# Patient Record
Sex: Female | Born: 1935 | Race: Black or African American | Hispanic: No | Marital: Single | Smoking: Never smoker
Health system: Southern US, Community
[De-identification: ages and names within clinical notes are randomized; demographics above are authoritative.]

## PROBLEM LIST (undated history)

## (undated) DIAGNOSIS — F0280 Dementia in other diseases classified elsewhere without behavioral disturbance: Secondary | ICD-10-CM

## (undated) DIAGNOSIS — C801 Malignant (primary) neoplasm, unspecified: Secondary | ICD-10-CM

## (undated) DIAGNOSIS — G629 Polyneuropathy, unspecified: Secondary | ICD-10-CM

## (undated) DIAGNOSIS — E7800 Pure hypercholesterolemia, unspecified: Secondary | ICD-10-CM

## (undated) DIAGNOSIS — R609 Edema, unspecified: Secondary | ICD-10-CM

## (undated) DIAGNOSIS — I10 Essential (primary) hypertension: Secondary | ICD-10-CM

## (undated) DIAGNOSIS — G309 Alzheimer's disease, unspecified: Secondary | ICD-10-CM

## (undated) HISTORY — PX: ABDOMINAL HYSTERECTOMY: SHX81

## (undated) HISTORY — PX: APPENDECTOMY: SHX54

---

## 2001-01-21 ENCOUNTER — Encounter: Payer: Self-pay | Admitting: Internal Medicine

## 2001-01-21 ENCOUNTER — Ambulatory Visit (HOSPITAL_COMMUNITY): Admission: RE | Admit: 2001-01-21 | Discharge: 2001-01-21 | Payer: Self-pay | Admitting: Internal Medicine

## 2001-04-02 ENCOUNTER — Ambulatory Visit (HOSPITAL_COMMUNITY): Admission: RE | Admit: 2001-04-02 | Discharge: 2001-04-02 | Payer: Self-pay | Admitting: General Surgery

## 2001-05-06 ENCOUNTER — Encounter: Payer: Self-pay | Admitting: Internal Medicine

## 2001-05-06 ENCOUNTER — Ambulatory Visit (HOSPITAL_COMMUNITY): Admission: RE | Admit: 2001-05-06 | Discharge: 2001-05-06 | Payer: Self-pay | Admitting: Internal Medicine

## 2003-01-17 ENCOUNTER — Encounter: Payer: Self-pay | Admitting: Family Medicine

## 2003-01-17 ENCOUNTER — Ambulatory Visit (HOSPITAL_COMMUNITY): Admission: RE | Admit: 2003-01-17 | Discharge: 2003-01-17 | Payer: Self-pay | Admitting: Family Medicine

## 2004-01-19 ENCOUNTER — Ambulatory Visit (HOSPITAL_COMMUNITY): Admission: RE | Admit: 2004-01-19 | Discharge: 2004-01-19 | Payer: Self-pay | Admitting: Family Medicine

## 2005-06-14 ENCOUNTER — Ambulatory Visit (HOSPITAL_COMMUNITY): Admission: RE | Admit: 2005-06-14 | Discharge: 2005-06-14 | Payer: Self-pay | Admitting: Family Medicine

## 2005-12-15 ENCOUNTER — Emergency Department (HOSPITAL_COMMUNITY): Admission: EM | Admit: 2005-12-15 | Discharge: 2005-12-15 | Payer: Self-pay | Admitting: Emergency Medicine

## 2006-03-09 ENCOUNTER — Emergency Department (HOSPITAL_COMMUNITY): Admission: EM | Admit: 2006-03-09 | Discharge: 2006-03-09 | Payer: Self-pay | Admitting: Emergency Medicine

## 2006-03-20 ENCOUNTER — Encounter: Admission: RE | Admit: 2006-03-20 | Discharge: 2006-03-20 | Payer: Self-pay | Admitting: Cardiology

## 2006-04-23 ENCOUNTER — Inpatient Hospital Stay (HOSPITAL_COMMUNITY): Admission: RE | Admit: 2006-04-23 | Discharge: 2006-04-27 | Payer: Self-pay | Admitting: Neurosurgery

## 2006-07-28 ENCOUNTER — Ambulatory Visit (HOSPITAL_COMMUNITY): Admission: RE | Admit: 2006-07-28 | Discharge: 2006-07-28 | Payer: Self-pay | Admitting: Family Medicine

## 2006-08-08 ENCOUNTER — Encounter: Admission: RE | Admit: 2006-08-08 | Discharge: 2006-08-08 | Payer: Self-pay | Admitting: Family Medicine

## 2006-09-03 ENCOUNTER — Ambulatory Visit: Payer: Self-pay | Admitting: Orthopedic Surgery

## 2007-07-17 ENCOUNTER — Ambulatory Visit (HOSPITAL_COMMUNITY): Admission: RE | Admit: 2007-07-17 | Discharge: 2007-07-17 | Payer: Self-pay | Admitting: Cardiology

## 2008-06-01 ENCOUNTER — Emergency Department (HOSPITAL_COMMUNITY): Admission: EM | Admit: 2008-06-01 | Discharge: 2008-06-01 | Payer: Self-pay | Admitting: Emergency Medicine

## 2008-06-02 ENCOUNTER — Ambulatory Visit: Payer: Self-pay | Admitting: Orthopedic Surgery

## 2008-06-02 DIAGNOSIS — S42209A Unspecified fracture of upper end of unspecified humerus, initial encounter for closed fracture: Secondary | ICD-10-CM | POA: Insufficient documentation

## 2008-06-09 ENCOUNTER — Ambulatory Visit: Payer: Self-pay | Admitting: Orthopedic Surgery

## 2008-06-16 ENCOUNTER — Ambulatory Visit: Payer: Self-pay | Admitting: Orthopedic Surgery

## 2008-06-21 ENCOUNTER — Ambulatory Visit (HOSPITAL_COMMUNITY): Admission: RE | Admit: 2008-06-21 | Discharge: 2008-06-21 | Payer: Self-pay | Admitting: Cardiology

## 2008-06-23 ENCOUNTER — Ambulatory Visit: Payer: Self-pay | Admitting: Orthopedic Surgery

## 2008-06-24 ENCOUNTER — Telehealth: Payer: Self-pay | Admitting: Orthopedic Surgery

## 2008-06-25 ENCOUNTER — Encounter: Payer: Self-pay | Admitting: Orthopedic Surgery

## 2008-06-27 ENCOUNTER — Telehealth: Payer: Self-pay | Admitting: Orthopedic Surgery

## 2008-06-29 ENCOUNTER — Encounter: Payer: Self-pay | Admitting: Orthopedic Surgery

## 2008-07-04 ENCOUNTER — Telehealth: Payer: Self-pay | Admitting: Orthopedic Surgery

## 2008-07-05 ENCOUNTER — Encounter (HOSPITAL_COMMUNITY): Admission: RE | Admit: 2008-07-05 | Discharge: 2008-08-11 | Payer: Self-pay | Admitting: Orthopedic Surgery

## 2008-07-15 ENCOUNTER — Encounter: Payer: Self-pay | Admitting: Orthopedic Surgery

## 2008-07-25 ENCOUNTER — Encounter: Payer: Self-pay | Admitting: Orthopedic Surgery

## 2008-08-02 ENCOUNTER — Encounter: Payer: Self-pay | Admitting: Orthopedic Surgery

## 2008-08-15 ENCOUNTER — Encounter (HOSPITAL_COMMUNITY): Admission: RE | Admit: 2008-08-15 | Discharge: 2008-09-14 | Payer: Self-pay | Admitting: Orthopedic Surgery

## 2008-08-16 ENCOUNTER — Encounter: Payer: Self-pay | Admitting: Orthopedic Surgery

## 2008-08-25 ENCOUNTER — Ambulatory Visit: Payer: Self-pay | Admitting: Orthopedic Surgery

## 2008-08-25 DIAGNOSIS — M7150 Other bursitis, not elsewhere classified, unspecified site: Secondary | ICD-10-CM | POA: Insufficient documentation

## 2008-08-25 DIAGNOSIS — M25549 Pain in joints of unspecified hand: Secondary | ICD-10-CM | POA: Insufficient documentation

## 2008-09-15 ENCOUNTER — Encounter (HOSPITAL_COMMUNITY): Admission: RE | Admit: 2008-09-15 | Discharge: 2008-09-23 | Payer: Self-pay | Admitting: Orthopedic Surgery

## 2008-09-23 ENCOUNTER — Encounter: Payer: Self-pay | Admitting: Orthopedic Surgery

## 2009-03-27 ENCOUNTER — Ambulatory Visit (HOSPITAL_COMMUNITY): Admission: RE | Admit: 2009-03-27 | Discharge: 2009-03-27 | Payer: Self-pay | Admitting: Family Medicine

## 2009-07-19 ENCOUNTER — Ambulatory Visit: Payer: Self-pay | Admitting: Orthopedic Surgery

## 2009-09-22 ENCOUNTER — Ambulatory Visit (HOSPITAL_COMMUNITY): Admission: RE | Admit: 2009-09-22 | Discharge: 2009-09-22 | Payer: Self-pay | Admitting: Family Medicine

## 2010-04-09 ENCOUNTER — Ambulatory Visit (HOSPITAL_COMMUNITY): Admission: RE | Admit: 2010-04-09 | Discharge: 2010-04-09 | Payer: Self-pay | Admitting: Gastroenterology

## 2010-04-20 ENCOUNTER — Emergency Department (HOSPITAL_COMMUNITY): Admission: EM | Admit: 2010-04-20 | Discharge: 2010-04-20 | Payer: Self-pay | Admitting: Emergency Medicine

## 2010-06-04 ENCOUNTER — Ambulatory Visit (HOSPITAL_COMMUNITY)
Admission: RE | Admit: 2010-06-04 | Discharge: 2010-06-04 | Payer: Self-pay | Source: Home / Self Care | Admitting: Family Medicine

## 2010-08-21 ENCOUNTER — Ambulatory Visit (HOSPITAL_COMMUNITY)
Admission: RE | Admit: 2010-08-21 | Discharge: 2010-08-21 | Payer: Self-pay | Source: Home / Self Care | Attending: Ophthalmology | Admitting: Ophthalmology

## 2010-08-27 LAB — POCT I-STAT 4, (NA,K, GLUC, HGB,HCT)
Glucose, Bld: 106 mg/dL — ABNORMAL HIGH (ref 70–99)
HCT: 33 % — ABNORMAL LOW (ref 36.0–46.0)
Hemoglobin: 11.2 g/dL — ABNORMAL LOW (ref 12.0–15.0)
Potassium: 3.9 mEq/L (ref 3.5–5.1)
Sodium: 142 mEq/L (ref 135–145)

## 2010-09-02 ENCOUNTER — Encounter: Payer: Self-pay | Admitting: Cardiology

## 2010-09-02 ENCOUNTER — Encounter: Payer: Self-pay | Admitting: Family Medicine

## 2010-09-20 ENCOUNTER — Other Ambulatory Visit (HOSPITAL_COMMUNITY): Payer: Self-pay

## 2010-10-25 LAB — COMPREHENSIVE METABOLIC PANEL
ALT: 22 U/L (ref 0–35)
AST: 35 U/L (ref 0–37)
Albumin: 3.2 g/dL — ABNORMAL LOW (ref 3.5–5.2)
Alkaline Phosphatase: 73 U/L (ref 39–117)
BUN: 15 mg/dL (ref 6–23)
CO2: 34 mEq/L — ABNORMAL HIGH (ref 19–32)
Calcium: 8.9 mg/dL (ref 8.4–10.5)
Chloride: 100 mEq/L (ref 96–112)
Creatinine, Ser: 0.59 mg/dL (ref 0.4–1.2)
GFR calc Af Amer: >60 mL/min (ref >60)
GFR calc non Af Amer: >60 mL/min (ref >60)
Glucose, Bld: 129 mg/dL — ABNORMAL HIGH (ref 70–99)
Potassium: 3.4 mEq/L — ABNORMAL LOW (ref 3.5–5.1)
Sodium: 141 mEq/L (ref 135–145)
Total Bilirubin: 0.4 mg/dL (ref 0.3–1.2)
Total Protein: 6.6 g/dL (ref 6.0–8.3)

## 2010-10-25 LAB — CBC
HCT: 32.8 % — ABNORMAL LOW (ref 36.0–46.0)
Hemoglobin: 11.2 g/dL — ABNORMAL LOW (ref 12.0–15.0)
MCH: 30.7 pg (ref 26.0–34.0)
MCHC: 34.2 g/dL (ref 30.0–36.0)
MCV: 89.8 fL (ref 78.0–100.0)
Platelets: 193 10*3/uL (ref 150–400)
RBC: 3.66 MIL/uL — ABNORMAL LOW (ref 3.87–5.11)
RDW: 14.2 % (ref 11.5–15.5)
WBC: 6.2 10*3/uL (ref 4.0–10.5)

## 2010-10-25 LAB — URINE CULTURE
Colony Count: 15000
Culture  Setup Time: 201109100155

## 2010-10-25 LAB — DIFFERENTIAL
Basophils Absolute: 0 10*3/uL (ref 0.0–0.1)
Basophils Relative: 0 % (ref 0–1)
Eosinophils Absolute: 0 10*3/uL (ref 0.0–0.7)
Eosinophils Relative: 0 % (ref 0–5)
Lymphocytes Relative: 18 % (ref 12–46)
Lymphs Abs: 1.1 10*3/uL (ref 0.7–4.0)
Monocytes Absolute: 0.4 10*3/uL (ref 0.1–1.0)
Monocytes Relative: 7 % (ref 3–12)
Neutro Abs: 4.6 10*3/uL (ref 1.7–7.7)
Neutrophils Relative %: 75 % (ref 43–77)

## 2010-10-25 LAB — URINALYSIS, ROUTINE W REFLEX MICROSCOPIC
Bilirubin Urine: NEGATIVE
Glucose, UA: NEGATIVE mg/dL
Hgb urine dipstick: NEGATIVE
Nitrite: NEGATIVE
Protein, ur: NEGATIVE mg/dL
Specific Gravity, Urine: 1.015 (ref 1.005–1.030)
Urobilinogen, UA: 1 mg/dL (ref 0.0–1.0)
pH: 8 (ref 5.0–8.0)

## 2010-12-28 NOTE — Discharge Summary (Signed)
Caitlin Kline, Caitlin Kline                ACCOUNT NO.:  [id number]   MEDICAL RECORD NO.:  [id number]          PATIENT TYPE:  INP   LOCATION:  3030                         FACILITY:  [facility]   PHYSICIAN:  Coletta Memos, M.D.     DATE OF BIRTH:  09-23-1931   DATE OF ADMISSION:  04/23/2006  DATE OF DISCHARGE:                                 DISCHARGE SUMMARY   ADMITTING DIAGNOSES:  1. Cervical spondylosis with myelopathy.  2. Cervical stenosis.  3. Degenerative disk disease with myelopathy.   POSTOPERATIVE DIAGNOSES:  1. Cervical spondylosis with myelopathy.  2. Cervical stenosis.  3. Degenerative disk disease with myelopathy.   PROCEDURE:  Anterior cervical corpectomy C4, C3-C5 arthrodesis with anterior  plating and PEAK interbody strut graft.   COMPLICATIONS:  None.   DISCHARGE STATUS:  Alive and well.  Wound clean and dry without signs of  infection.   INDICATIONS:  [name] is a 77 year old woman who presented with  profound cervical stenosis and myelopathy.  She had cord signal changes  behind the body of C4 and at C4-5 disk space and at C3-4.  I therefore  recommended and she agreed to undergo operative decompression.  She was  admitted, taken to the operating room and had an uncomplicated procedure.  Postoperatively she has continued to improve.  Her strength is better in the  upper extremities.  She has improved coordination in the lower extremities  though her gait is still not normal.  She will need home health physical  therapy.  She will be discharged home with Vicodin and Flexeril for pain  control and for muscle spasms.  She was given instructions as to no driving  for 2 weeks.  She was told she could not shower.  She is tolerating a  regular diet and voiding at discharge.           ______________________________  Coletta Memos, M.D.     KC/MEDQ  D:  04/25/2006  T:  04/26/2006  Job:  [id number]

## 2010-12-28 NOTE — Op Note (Signed)
[name], MCCLAFFERTY                ACCOUNT NO.:  [id number]   MEDICAL RECORD NO.:  [id number]          PATIENT TYPE:  INP   LOCATION:  3303                         FACILITY:  [facility]   PHYSICIAN:  Coletta Memos, M.D.     DATE OF BIRTH:  05-Dec-1931   DATE OF PROCEDURE:  04/23/2006  DATE OF DISCHARGE:                                 OPERATIVE REPORT   PREOPERATIVE DIAGNOSES:  Cervical spondylosis with myelopathy, cervical  stenosis, cervical degenerative disk disease with myelopathy C3-4, C4-5.   POSTOPERATIVE DIAGNOSES:  Cervical spondylosis with myelopathy, cervical  stenosis, cervical degenerative disk disease with myelopathy C3-4, C4-5.   PROCEDURES:  1. C4 corpectomy for decompression of the spinal canal.  2. C3 to C5 arthrodesis with PEEK 24 mm strut graft filled with      morcellized autograft.  3. Anterior plating, C3 to C5.   INDICATIONS:  Caitlin Kline is a 77 year old who presented to my office for  evaluation of lumbar stenosis.  She had profound stenosis in the lumbar  spine, but on examination I noted that she was quite hyperreflexic and  myelopathic.  She had had difficulty with her gait, and this frankly fit  much better with a cervical etiology than the lumbar.  MRI was performed and  it showed cord signal behind the C4 body and large disk herniations at C3-4  and at C4-5.  I therefore recommended and she agreed to undergo operative  decompression.   OPERATIVE NOTE:  [name] was brought to the operating room, intubated,  placed under general anesthetic without difficulty.  Her head was positioned  essentially in neutral on a horseshoe headrest.  Her neck was prepped.  She  was draped in a sterile fashion.  I infiltrated 5 mL 0.5% lidocaine,  1:200,000 strength of epinephrine.  I opened the skin with a #10 blade  starting from the midline and extending to the medial border of the left  sternocleidomastoid at the level of the Adam's apple.  I took this down to  the platysma.  I opened that and then created an avascular corridor with  sharp and blunt dissection to the cervical spine.  I placed a spinal needle  and was at the C3-4 space.  I then reflected the longus colli muscles to  expose C3-4 and C4-5.  I then placed the self-retaining retractor.  I opened  the disk spaces at C3-4 and at C4-5 with a #15 blade.  I used curettes and  pituitary rongeurs to remove disk material.  I initially planned to do a two-  level ACD.  However, in doing the diskectomies at each level I frankly  thought it would be easier and safer for the patient to do a corpectomy.  She had such large osteophytes at C4 that I felt by the time I had finished  reshaping the bone in order to get to the disk spaces that I would have a  little more than a sliver.  Given that, I went ahead and performed a  corpectomy of C4.  I used both a high-speed drill and a  [place] rongeur.  With Dr. Doreen Beam assistance we decompressed the spinal canal quite easily.  The spinal cord was pulsatile after the decompression was done.  We then  placed a 22 mm PEEK interbody graft packed with morcellized autograft.  That, however, was a little bit loose in the space.  So I therefore placed a  24 mm strut filled with morcellized autograft PEEK into the space.  Then  placed the plate, 2 screws in C3, 2 screws in C5.  X-ray had shown that the graft was in good position.  I irrigated the wound.  I then closed the wound in layered fashion with the assistance of Dr.  Phoebe Perch.  The platysma was reapproximated, subcuticular tissue.  Dermabond  used for a sterile dressing.  [name] tolerated the procedure well,  moving all extremities postop.           ______________________________  Coletta Memos, M.D.     KC/MEDQ  D:  04/23/2006  T:  04/24/2006  Job:  [id number]

## 2011-06-03 ENCOUNTER — Other Ambulatory Visit: Payer: Self-pay

## 2011-06-03 ENCOUNTER — Encounter: Payer: Self-pay | Admitting: *Deleted

## 2011-06-03 ENCOUNTER — Observation Stay (HOSPITAL_COMMUNITY)
Admission: EM | Admit: 2011-06-03 | Discharge: 2011-06-07 | Disposition: A | Discharge status: Home / Self Care | Payer: Medicare Other | Attending: Internal Medicine | Admitting: Internal Medicine

## 2011-06-03 ENCOUNTER — Emergency Department (HOSPITAL_COMMUNITY): Payer: Medicare Other

## 2011-06-03 DIAGNOSIS — Z79899 Other long term (current) drug therapy: Secondary | ICD-10-CM | POA: Insufficient documentation

## 2011-06-03 DIAGNOSIS — I658 Occlusion and stenosis of other precerebral arteries: Secondary | ICD-10-CM | POA: Insufficient documentation

## 2011-06-03 DIAGNOSIS — R079 Chest pain, unspecified: Secondary | ICD-10-CM | POA: Diagnosis present

## 2011-06-03 DIAGNOSIS — H9190 Unspecified hearing loss, unspecified ear: Secondary | ICD-10-CM | POA: Diagnosis present

## 2011-06-03 DIAGNOSIS — R600 Localized edema: Secondary | ICD-10-CM | POA: Diagnosis present

## 2011-06-03 DIAGNOSIS — I498 Other specified cardiac arrhythmias: Secondary | ICD-10-CM | POA: Insufficient documentation

## 2011-06-03 DIAGNOSIS — E1140 Type 2 diabetes mellitus with diabetic neuropathy, unspecified: Secondary | ICD-10-CM | POA: Diagnosis present

## 2011-06-03 DIAGNOSIS — D649 Anemia, unspecified: Secondary | ICD-10-CM | POA: Insufficient documentation

## 2011-06-03 DIAGNOSIS — E119 Type 2 diabetes mellitus without complications: Secondary | ICD-10-CM | POA: Diagnosis present

## 2011-06-03 DIAGNOSIS — I6529 Occlusion and stenosis of unspecified carotid artery: Secondary | ICD-10-CM | POA: Insufficient documentation

## 2011-06-03 DIAGNOSIS — E1142 Type 2 diabetes mellitus with diabetic polyneuropathy: Secondary | ICD-10-CM | POA: Insufficient documentation

## 2011-06-03 DIAGNOSIS — R0789 Other chest pain: Principal | ICD-10-CM | POA: Insufficient documentation

## 2011-06-03 DIAGNOSIS — R001 Bradycardia, unspecified: Secondary | ICD-10-CM | POA: Diagnosis present

## 2011-06-03 DIAGNOSIS — I10 Essential (primary) hypertension: Secondary | ICD-10-CM | POA: Diagnosis present

## 2011-06-03 DIAGNOSIS — E1149 Type 2 diabetes mellitus with other diabetic neurological complication: Secondary | ICD-10-CM | POA: Insufficient documentation

## 2011-06-03 DIAGNOSIS — R609 Edema, unspecified: Secondary | ICD-10-CM | POA: Insufficient documentation

## 2011-06-03 HISTORY — DX: Polyneuropathy, unspecified: G62.9

## 2011-06-03 HISTORY — DX: Essential (primary) hypertension: I10

## 2011-06-03 HISTORY — DX: Malignant (primary) neoplasm, unspecified: C80.1

## 2011-06-03 HISTORY — DX: Pure hypercholesterolemia, unspecified: E78.00

## 2011-06-03 LAB — POCT I-STAT TROPONIN I: Troponin i, poc: 0 ng/mL (ref 0.00–0.08)

## 2011-06-03 NOTE — ED Provider Notes (Signed)
History     CSN: [id number] Arrival date & time: 06/03/2011  9:37 PM   First MD Initiated Contact with Patient 06/03/11 2208      Chief Complaint  Patient presents with  . Chest Pain    (Consider location/radiation/quality/duration/timing/severity/associated sxs/prior treatment) HPI... intermittent chest pain for 2 days described as achiness. No dyspnea, diaphoresis, nausea. No previous heart troubles. Has hypertension and diabetes. Feels weak with significant lower extremity edema. Nothing makes pain better or worse. No radiation.  Past Medical History  Diagnosis Date  . Diabetes mellitus   . Hypertension   . Hypercholesteremia   . Neuropathy     Past Surgical History  Procedure Date  . Abdominal hysterectomy   . Appendectomy     History reviewed. No pertinent family history.  History  Substance Use Topics  . Smoking status: Never Smoker   . Smokeless tobacco: Never Used  . Alcohol Use: No    OB History    Grav Para Term Preterm Abortions TAB SAB Ect Mult Living                  Review of Systems  All other systems reviewed and are negative.    Allergies  Review of patient's allergies indicates no known allergies.  Home Medications   Current Outpatient Rx  Name Route Sig Dispense Refill  . ALISKIREN-VALSARTAN 150-160 MG PO TABS Oral Take 1 tablet by mouth daily. For bloodpressure     . ASPIRIN 325 MG PO TABS Oral Take 325 mg by mouth daily.      . DONEPEZIL HCL 10 MG PO TABS Oral Take 10 mg by mouth at bedtime as needed.      . FUROSEMIDE 20 MG PO TABS Oral Take 20 mg by mouth as directed. Take one tablet on Mondays, Wednesdays, Fridays, Saturdays, and Sundays. Take two tablets on Tuesdays and Thursdays     . GABAPENTIN 300 MG PO CAPS Oral Take 300 mg by mouth every evening. For left foot pain     . GLIPIZIDE ER 10 MG PO TB24 Oral Take 10 mg by mouth daily.      Marland Kitchen MEMANTINE HCL 10 MG PO TABS Oral Take 10 mg by mouth 2 (two) times daily.      . CENTRUM  SILVER ULTRA WOMENS PO Oral Take 1 tablet by mouth daily.      Marland Kitchen PIOGLITAZONE HCL 15 MG PO TABS Oral Take 15 mg by mouth daily.      Marland Kitchen POLYETHYLENE GLYCOL 3350 PO POWD Oral Take 17 g by mouth daily as needed. For constipation     . POTASSIUM CHLORIDE CRYS CR 20 MEQ PO TBCR Oral Take 20 mEq by mouth as directed. Take one tablet by mouth on Mondays, Wednesdays, Fridays, Saturdays, and Sundays, and take two tablets on Tuesdays and Thursdays       There were no vitals taken for this visit.  Physical Exam  Nursing note and vitals reviewed. Constitutional: She is oriented to person, place, and time. She appears well-developed and well-nourished.  HENT:  Head: Normocephalic and atraumatic.  Eyes: Conjunctivae and EOM are normal. Pupils are equal, round, and reactive to light.  Neck: Normal range of motion. Neck supple.  Cardiovascular: Normal rate and regular rhythm.   Pulmonary/Chest: Effort normal and breath sounds normal.  Abdominal: Soft. Bowel sounds are normal.  Musculoskeletal: Normal range of motion.       3+ lower extremity edema.  Neurological: She is alert and oriented to  person, place, and time.  Skin: Skin is warm and dry.  Psychiatric: She has a normal mood and affect.    ED Course  Procedures (including critical care time)   Labs Reviewed  POCT I-STAT TROPONIN I  POCT CARDIAC MARKERS  CBC  DIFFERENTIAL  BASIC METABOLIC PANEL  CARDIAC PANEL(CRET KIN+CKTOT+MB+TROPI)   Dg Chest Portable 1 View  06/03/2011  *RADIOLOGY REPORT*  Clinical Data: Chest pain today.  PORTABLE CHEST - 1 VIEW  Comparison: None.  Findings: Patient has had prior cervical fusion.  The heart is enlarged.  There are no focal consolidations or pleural effusions. Degenerative changes are seen in the spine.  The patient has a deformity of the right proximal humerus.  IMPRESSION:  1.  Cardiomegaly without evidence for pulmonary edema. 2. No focal pulmonary abnormality.  Original Report Authenticated By:  Patterson Hammersmith, M.D.    Date: 06/03/2011  Rate: 69  Rhythm: normal sinus rhythm  QRS Axis: normal  Intervals: normal  ST/T Wave abnormalities: normal  Conduction Disutrbances:none  Narrative Interpretation:   Old EKG Reviewed: none available   No diagnosis found.    MDM  Patient has risk factors for CAD. Concerned about same. Likely admission        Donnetta Hutching, MD 06/03/11 2354

## 2011-06-03 NOTE — ED Notes (Signed)
Per EMS - pt from home - pt report intermittent chest pain that began 3 days ago - pt states after eating tonight onset of symptoms occurred.

## 2011-06-04 ENCOUNTER — Encounter (HOSPITAL_COMMUNITY): Payer: Self-pay | Admitting: Internal Medicine

## 2011-06-04 ENCOUNTER — Emergency Department (HOSPITAL_COMMUNITY): Payer: Medicare Other

## 2011-06-04 ENCOUNTER — Observation Stay (HOSPITAL_COMMUNITY): Payer: Medicare Other

## 2011-06-04 DIAGNOSIS — R600 Localized edema: Secondary | ICD-10-CM | POA: Diagnosis present

## 2011-06-04 DIAGNOSIS — I498 Other specified cardiac arrhythmias: Secondary | ICD-10-CM

## 2011-06-04 DIAGNOSIS — I517 Cardiomegaly: Secondary | ICD-10-CM

## 2011-06-04 DIAGNOSIS — H9190 Unspecified hearing loss, unspecified ear: Secondary | ICD-10-CM | POA: Diagnosis present

## 2011-06-04 DIAGNOSIS — R001 Bradycardia, unspecified: Secondary | ICD-10-CM | POA: Diagnosis present

## 2011-06-04 DIAGNOSIS — R079 Chest pain, unspecified: Secondary | ICD-10-CM | POA: Diagnosis present

## 2011-06-04 DIAGNOSIS — I10 Essential (primary) hypertension: Secondary | ICD-10-CM | POA: Diagnosis present

## 2011-06-04 DIAGNOSIS — E1140 Type 2 diabetes mellitus with diabetic neuropathy, unspecified: Secondary | ICD-10-CM | POA: Diagnosis present

## 2011-06-04 DIAGNOSIS — E119 Type 2 diabetes mellitus without complications: Secondary | ICD-10-CM | POA: Diagnosis present

## 2011-06-04 LAB — CARDIAC PANEL(CRET KIN+CKTOT+MB+TROPI)
CK, MB: 2.6 ng/mL (ref 0.3–4.0)
Relative Index: 1.3 (ref 0.0–2.5)
Relative Index: 1.4 (ref 0.0–2.5)
Total CK: 207 U/L — ABNORMAL HIGH (ref 7–177)
Troponin I: <0.3 ng/mL (ref <0.30)

## 2011-06-04 LAB — BASIC METABOLIC PANEL
BUN: 23 mg/dL (ref 6–23)
CO2: 30 mEq/L (ref 19–32)
Calcium: 9.3 mg/dL (ref 8.4–10.5)
Chloride: 99 mEq/L (ref 96–112)
Creatinine, Ser: 0.95 mg/dL (ref 0.50–1.10)
GFR calc Af Amer: 65 mL/min — ABNORMAL LOW (ref >90)
GFR calc non Af Amer: 56 mL/min — ABNORMAL LOW (ref >90)
Glucose, Bld: 205 mg/dL — ABNORMAL HIGH (ref 70–99)
Potassium: 3.9 mEq/L (ref 3.5–5.1)
Sodium: 137 mEq/L (ref 135–145)

## 2011-06-04 LAB — CBC
HCT: 30 % — ABNORMAL LOW (ref 36.0–46.0)
HCT: 30.4 % — ABNORMAL LOW (ref 36.0–46.0)
Hemoglobin: 10.5 g/dL — ABNORMAL LOW (ref 12.0–15.0)
Hemoglobin: 10.6 g/dL — ABNORMAL LOW (ref 12.0–15.0)
MCH: 30.1 pg (ref 26.0–34.0)
MCH: 30 pg (ref 26.0–34.0)
MCHC: 35 g/dL (ref 30.0–36.0)
MCHC: 34.9 g/dL (ref 30.0–36.0)
MCV: 86 fL (ref 78.0–100.0)
Platelets: 265 10*3/uL (ref 150–400)
RBC: 3.49 MIL/uL — ABNORMAL LOW (ref 3.87–5.11)
RBC: 3.53 MIL/uL — ABNORMAL LOW (ref 3.87–5.11)
RDW: 13.4 % (ref 11.5–15.5)
WBC: 7.7 10*3/uL (ref 4.0–10.5)

## 2011-06-04 LAB — DIFFERENTIAL
Basophils Absolute: 0 10*3/uL (ref 0.0–0.1)
Basophils Relative: 0 % (ref 0–1)
Eosinophils Absolute: 0 10*3/uL (ref 0.0–0.7)
Eosinophils Relative: 0 % (ref 0–5)
Lymphocytes Relative: 18 % (ref 12–46)
Lymphs Abs: 1.4 10*3/uL (ref 0.7–4.0)
Monocytes Absolute: 0.7 10*3/uL (ref 0.1–1.0)
Monocytes Relative: 10 % (ref 3–12)
Neutro Abs: 5.6 10*3/uL (ref 1.7–7.7)
Neutrophils Relative %: 72 % (ref 43–77)

## 2011-06-04 LAB — LIPID PANEL
HDL: 90 mg/dL (ref >39)
LDL Cholesterol: 38 mg/dL (ref 0–99)
Total CHOL/HDL Ratio: 1.5 RATIO
Triglycerides: 34 mg/dL (ref <150)

## 2011-06-04 LAB — FERRITIN: Ferritin: 9 ng/mL — ABNORMAL LOW (ref 10–291)

## 2011-06-04 LAB — GLUCOSE, CAPILLARY: Glucose-Capillary: 92 mg/dL (ref 70–99)

## 2011-06-04 LAB — HEPATIC FUNCTION PANEL
Albumin: 3 g/dL — ABNORMAL LOW (ref 3.5–5.2)
Alkaline Phosphatase: 89 U/L (ref 39–117)
Total Bilirubin: 0.3 mg/dL (ref 0.3–1.2)

## 2011-06-04 LAB — URINALYSIS, ROUTINE W REFLEX MICROSCOPIC
Glucose, UA: NEGATIVE mg/dL
Leukocytes, UA: NEGATIVE
Specific Gravity, Urine: 1.02 (ref 1.005–1.030)
pH: 6 (ref 5.0–8.0)

## 2011-06-04 LAB — MAGNESIUM: Magnesium: 2.3 mg/dL (ref 1.5–2.5)

## 2011-06-04 LAB — D-DIMER, QUANTITATIVE: D-Dimer, Quant: 1.03 ug/mL-FEU — ABNORMAL HIGH (ref 0.00–0.48)

## 2011-06-04 LAB — IRON AND TIBC: Iron: 39 ug/dL — ABNORMAL LOW (ref 42–135)

## 2011-06-04 LAB — CREATININE, SERUM: GFR calc non Af Amer: 66 mL/min — ABNORMAL LOW (ref >90)

## 2011-06-04 LAB — RETICULOCYTES
Retic Count, Absolute: 48.9 10*3/uL (ref 19.0–186.0)
Retic Ct Pct: 1.4 % (ref 0.4–3.1)

## 2011-06-04 MED ORDER — POLYETHYLENE GLYCOL 3350 17 GM/SCOOP PO POWD: 17.0000 g | Freq: Every day | ORAL | Status: DC | PRN

## 2011-06-04 MED ORDER — ACETAMINOPHEN 325 MG PO TABS
650.0000 mg | ORAL_TABLET | Freq: Four times a day (QID) | ORAL | Status: DC | PRN
  Administered 2011-06-04 – 2011-06-06 (×4): 650 mg via ORAL
  Filled 2011-06-04 (×3): qty 2

## 2011-06-04 MED ORDER — ASPIRIN 81 MG PO CHEW
324.0000 mg | CHEWABLE_TABLET | Freq: Once | ORAL | Status: AC
  Administered 2011-06-04: 324 mg via ORAL
  Filled 2011-06-04: qty 4

## 2011-06-04 MED ORDER — POTASSIUM CHLORIDE CRYS ER 20 MEQ PO TBCR
20.0000 meq | EXTENDED_RELEASE_TABLET | ORAL | Status: DC
  Administered 2011-06-05 – 2011-06-07 (×2): 20 meq via ORAL
  Filled 2011-06-04 (×2): qty 1

## 2011-06-04 MED ORDER — ENOXAPARIN SODIUM 40 MG/0.4ML ~~LOC~~ SOLN
40.0000 mg | SUBCUTANEOUS | Status: DC
  Administered 2011-06-04 – 2011-06-07 (×4): 40 mg via SUBCUTANEOUS
  Filled 2011-06-04: qty 0.4
  Filled 2011-06-04: qty 0.8
  Filled 2011-06-04 (×2): qty 0.4

## 2011-06-04 MED ORDER — DONEPEZIL HCL 5 MG PO TABS
10.0000 mg | ORAL_TABLET | Freq: Every day | ORAL | Status: DC
  Administered 2011-06-04 – 2011-06-06 (×3): 10 mg via ORAL
  Filled 2011-06-04 (×3): qty 2

## 2011-06-04 MED ORDER — BISACODYL 10 MG RE SUPP: 10.0000 mg | RECTAL | Status: DC | PRN

## 2011-06-04 MED ORDER — TECHNETIUM TO 99M ALBUMIN AGGREGATED
5.0000 | Freq: Once | INTRAVENOUS | Status: AC | PRN
  Administered 2011-06-04: 5 via INTRAVENOUS

## 2011-06-04 MED ORDER — FLEET ENEMA 7-19 GM/118ML RE ENEM: 1.0000 | ENEMA | RECTAL | Status: DC | PRN

## 2011-06-04 MED ORDER — INSULIN ASPART 100 UNIT/ML ~~LOC~~ SOLN
0.0000 [IU] | Freq: Every day | SUBCUTANEOUS | Status: DC
  Administered 2011-06-04: 0 [IU] via SUBCUTANEOUS
  Filled 2011-06-04: qty 3

## 2011-06-04 MED ORDER — GLIPIZIDE ER 10 MG PO TB24
10.0000 mg | ORAL_TABLET | Freq: Every day | ORAL | Status: DC
  Administered 2011-06-05 – 2011-06-07 (×3): 10 mg via ORAL
  Filled 2011-06-04 (×3): qty 1

## 2011-06-04 MED ORDER — DOCUSATE SODIUM 100 MG PO CAPS: 100.0000 mg | ORAL_CAPSULE | Freq: Two times a day (BID) | ORAL | Status: DC | PRN

## 2011-06-04 MED ORDER — ACETAMINOPHEN 650 MG RE SUPP: 650.0000 mg | Freq: Four times a day (QID) | RECTAL | Status: DC | PRN

## 2011-06-04 MED ORDER — TRAZODONE HCL 50 MG PO TABS
25.0000 mg | ORAL_TABLET | Freq: Every evening | ORAL | Status: DC | PRN
  Administered 2011-06-05: 25 mg via ORAL
  Filled 2011-06-04: qty 1

## 2011-06-04 MED ORDER — POTASSIUM CHLORIDE CRYS ER 20 MEQ PO TBCR
40.0000 meq | EXTENDED_RELEASE_TABLET | ORAL | Status: DC
  Administered 2011-06-06: 40 meq via ORAL
  Filled 2011-06-04: qty 2

## 2011-06-04 MED ORDER — INSULIN ASPART 100 UNIT/ML ~~LOC~~ SOLN
0.0000 [IU] | Freq: Three times a day (TID) | SUBCUTANEOUS | Status: DC
  Administered 2011-06-05: 3 [IU] via SUBCUTANEOUS
  Administered 2011-06-06 (×2): 2 [IU] via SUBCUTANEOUS
  Filled 2011-06-04: qty 10

## 2011-06-04 MED ORDER — XENON XE 133 GAS
10.0000 | GAS_FOR_INHALATION | Freq: Once | RESPIRATORY_TRACT | Status: AC | PRN
  Administered 2011-06-04: 9.3 via RESPIRATORY_TRACT

## 2011-06-04 MED ORDER — POLYETHYLENE GLYCOL 3350 17 G PO PACK: 17.0000 g | PACK | Freq: Every day | ORAL | Status: DC | PRN

## 2011-06-04 MED ORDER — GABAPENTIN 300 MG PO CAPS
300.0000 mg | ORAL_CAPSULE | Freq: Every evening | ORAL | Status: DC
  Administered 2011-06-04 – 2011-06-06 (×3): 300 mg via ORAL
  Filled 2011-06-04 (×3): qty 1

## 2011-06-04 MED ORDER — ASPIRIN 325 MG PO TABS
325.0000 mg | ORAL_TABLET | Freq: Every day | ORAL | Status: DC
  Administered 2011-06-04 – 2011-06-07 (×4): 325 mg via ORAL
  Filled 2011-06-04 (×4): qty 1

## 2011-06-04 MED ORDER — FUROSEMIDE 40 MG PO TABS
40.0000 mg | ORAL_TABLET | ORAL | Status: DC
  Administered 2011-06-06: 40 mg via ORAL
  Filled 2011-06-04: qty 1

## 2011-06-04 MED ORDER — OLMESARTAN MEDOXOMIL 20 MG PO TABS
20.0000 mg | ORAL_TABLET | Freq: Every day | ORAL | Status: DC
  Administered 2011-06-05: 20 mg via ORAL
  Filled 2011-06-04: qty 1

## 2011-06-04 MED ORDER — ACETAMINOPHEN 325 MG PO TABS
650.0000 mg | ORAL_TABLET | Freq: Once | ORAL | Status: AC
  Administered 2011-06-04: 650 mg via ORAL
  Filled 2011-06-04: qty 2

## 2011-06-04 MED ORDER — ONDANSETRON HCL 4 MG PO TABS: 4.0000 mg | ORAL_TABLET | Freq: Four times a day (QID) | ORAL | Status: DC | PRN

## 2011-06-04 MED ORDER — ALISKIREN FUMARATE 150 MG PO TABS
150.0000 mg | ORAL_TABLET | Freq: Every day | ORAL | Status: DC
  Administered 2011-06-05: 150 mg via ORAL
  Filled 2011-06-04 (×4): qty 1

## 2011-06-04 MED ORDER — FUROSEMIDE 20 MG PO TABS
20.0000 mg | ORAL_TABLET | ORAL | Status: DC
  Administered 2011-06-05 – 2011-06-07 (×2): 20 mg via ORAL
  Filled 2011-06-04 (×2): qty 1

## 2011-06-04 MED ORDER — MEMANTINE HCL 10 MG PO TABS
10.0000 mg | ORAL_TABLET | Freq: Two times a day (BID) | ORAL | Status: DC
  Administered 2011-06-04 – 2011-06-07 (×6): 10 mg via ORAL
  Filled 2011-06-04 (×6): qty 1

## 2011-06-04 MED ORDER — SODIUM CHLORIDE 0.9 % IJ SOLN
INTRAMUSCULAR | Status: AC
  Filled 2011-06-04: qty 10

## 2011-06-04 MED ORDER — REGADENOSON 0.4 MG/5ML IV SOLN
0.4000 mg | Freq: Once | INTRAVENOUS | Status: DC
  Filled 2011-06-04: qty 5

## 2011-06-04 MED ORDER — FUROSEMIDE 20 MG PO TABS: 20.0000 mg | ORAL_TABLET | ORAL | Status: DC

## 2011-06-04 MED ORDER — ALISKIREN-VALSARTAN 150-160 MG PO TABS: 1.0000 | ORAL_TABLET | Freq: Every day | ORAL | Status: DC

## 2011-06-04 MED ORDER — ONDANSETRON HCL 4 MG/2ML IJ SOLN: 4.0000 mg | Freq: Four times a day (QID) | INTRAMUSCULAR | Status: DC | PRN

## 2011-06-04 MED ORDER — POTASSIUM CHLORIDE CRYS ER 20 MEQ PO TBCR: 20.0000 meq | EXTENDED_RELEASE_TABLET | ORAL | Status: DC

## 2011-06-04 NOTE — Plan of Care (Signed)
Problem: Phase I Progression Outcomes Goal: Initial discharge plan identified Outcome: Completed/Met Date Met:  06/04/11 Home at d/c

## 2011-06-04 NOTE — Progress Notes (Signed)
*  PRELIMINARY RESULTS* Echocardiogram 2D Echocardiogram has been performed.  Caitlin Kline  06/04/2011, 3:44 PM

## 2011-06-04 NOTE — ED Notes (Signed)
Patient has no chest pain at this time, vital signs in reasonable levels, troponin negative, discussed with hospitalist who agrees to observation for admission for rule out acute coronary syndrome.  Vida Roller, MD 06/04/11 [phone]

## 2011-06-04 NOTE — H&P (Signed)
PCP:   Evlyn Courier, MD   Chief Complaint:  Chest pain x2 days  HPI: Caitlin Kline is an 77 y.o. female.   Diabetes, hypertension, lives alone no history of coronary disease; has been having intermittent chest pain for the past 2 days. She describes the pain as dull and achy no radiation, intensity at about 8/10, lasts for half an hour to an hour and a half, no sweating no dizziness no diaphoresis no nausea or vomiting. Because of its persistent Paps patient presented to the emergency room for evaluation.  Patient has chronic lower extremity edema for at least 2-3 years, he has had no worsening of the edema she has had no leg pain she has had no extended travel.  Denies fever cough or cold; denies dyspnea with exertion.  Past Medical History  Diagnosis Date  . Diabetes mellitus   . Hypertension   . Hypercholesteremia   . Neuropathy     Past Surgical History  Procedure Date  . Abdominal hysterectomy   . Appendectomy     Medications:  HOME MEDS: Prior to Admission medications   Medication Sig Start Date End Date Taking? Authorizing Provider  Aliskiren-Valsartan (VALTURNA) 150-160 MG TABS Take 1 tablet by mouth daily. For bloodpressure    Yes Historical Provider, MD  aspirin 325 MG tablet Take 325 mg by mouth daily.     Yes Historical Provider, MD  donepezil (ARICEPT) 10 MG tablet Take 10 mg by mouth at bedtime as needed.     Yes Historical Provider, MD  furosemide (LASIX) 20 MG tablet Take 20 mg by mouth as directed. Take one tablet on Mondays, Wednesdays, Fridays, Saturdays, and Sundays. Take two tablets on Tuesdays and Thursdays    Yes Historical Provider, MD  gabapentin (NEURONTIN) 300 MG capsule Take 300 mg by mouth every evening. For left foot pain    Yes Historical Provider, MD  glipiZIDE (GLUCOTROL XL) 10 MG 24 hr tablet Take 10 mg by mouth daily.     Yes Historical Provider, MD  memantine (NAMENDA) 10 MG tablet Take 10 mg by mouth 2 (two) times daily.     Yes Historical  Provider, MD  Multiple Vitamins-Minerals (CENTRUM SILVER ULTRA WOMENS PO) Take 1 tablet by mouth daily.     Yes Historical Provider, MD  pioglitazone (ACTOS) 15 MG tablet Take 15 mg by mouth daily.     Yes Historical Provider, MD  polyethylene glycol powder (GLYCOLAX/MIRALAX) powder Take 17 g by mouth daily as needed. For constipation    Yes Historical Provider, MD  potassium chloride SA (K-DUR,KLOR-CON) 20 MEQ tablet Take 20 mEq by mouth as directed. Take one tablet by mouth on Mondays, Wednesdays, Fridays, Saturdays, and Sundays, and take two tablets on Tuesdays and Thursdays    Yes Historical Provider, MD    PRIOR TO [facility]DISSION MEDS Medications Prior to Admission  Medication Dose Route Frequency Provider Last Rate Last Dose  . acetaminophen (TYLENOL) tablet 650 mg  650 mg Oral Once Vida Roller, MD   650 mg at 06/04/11 0223  . aspirin chewable tablet 324 mg  324 mg Oral Once Vida Roller, MD   324 mg at 06/04/11 0139   No current outpatient prescriptions on file as of 06/03/2011.    Allergies:  No Known Allergies  Social History:   reports that she has never smoked. She has never used smokeless tobacco. She reports that she does not drink alcohol or use illicit drugs.  Family History: Family History  Problem Relation Age of Onset  . Stroke Sister   . Heart failure Sister   . Diabetes Sister   . Cancer Brother     Rewiew of Systems:  The patient denies anorexia, fever, weight loss,, vision loss, decreased hearing, hoarseness,  syncope, dyspnea on exertion,, balance deficits, hemoptysis, abdominal pain, melena, hematochezia, severe indigestion/heartburn, hematuria, incontinence, genital sores, muscle weakness, suspicious skin lesions, transient blindness, difficulty walking, depression, unusual weight change, abnormal bleeding, enlarged lymph nodes, angioedema, and breast masses.   Physical Exam: Filed Vitals:   06/03/11 2200 06/04/11 0008 06/04/11 0043 06/04/11 0257  BP:  135/95  144/52 146/46  Pulse: 70 52  47  Resp:  16 14 14   SpO2: 99% 97% 97% 95%   Blood pressure 146/46, pulse 47, resp. rate 14, SpO2 95.00%.  GEN: Pleasant elderly African American lady lying in the stretcher in no acute distress; cooperative with exam PSYCH: He is alert and oriented x4; does not appear anxious does not appear depressed; affect is somewhat flat HEENT: Mucous membranes pink and anicteric; PERRLA; EOM intact; no cervical lymphadenopathy nor thyromegaly or carotid bruit; no JVD; Breasts:: Not examined CHEST WALL: No tenderness CHEST: Normal respiration, clear to auscultation bilaterally HEART: Bradycardic Regular  rhythm; no murmurs rubs or gallops BACK: No kyphosis or scoliosis; no CVA tenderness ABDOMEN: Obese, soft non-tender; no masses, no organomegaly, normal abdominal bowel sounds; no pannus; no intertriginous candida. Rectal Exam: Not done EXTREMITIES: arthropathy of the hands and knees and especially ankles; 1+ edema bilateral lower extremity to knees, appears chronic; no ulcerations. Genitalia: not examined PULSES: 2+ and symmetric SKIN: Normal hydration no rash or ulceration CNS: Cranial nerves 2-12 grossly intact no focal neurologic deficit   Labs & Imaging Results for orders placed during the hospital encounter of 06/03/11 (from the past 48 hour(s))  CBC     Status: Abnormal   Collection Time   06/03/11 10:04 PM      Component Value Range Comment   WBC 7.7  4.0 - 10.5 (K/uL)    RBC 3.49 (*) 3.87 - 5.11 (MIL/uL)    Hemoglobin 10.5 (*) 12.0 - 15.0 (g/dL)    HCT 02.7 (*) 25.3 - 46.0 (%)    MCV 86.0  78.0 - 100.0 (fL)    MCH 30.1  26.0 - 34.0 (pg)    MCHC 35.0  30.0 - 36.0 (g/dL)    RDW 66.4  40.3 - 47.4 (%)    Platelets 265  150 - 400 (K/uL)   DIFFERENTIAL     Status: Normal   Collection Time   06/03/11 10:04 PM      Component Value Range Comment   Neutrophils Relative 72  43 - 77 (%)    Neutro Abs 5.6  1.7 - 7.7 (K/uL)    Lymphocytes Relative 18   12 - 46 (%)    Lymphs Abs 1.4  0.7 - 4.0 (K/uL)    Monocytes Relative 10  3 - 12 (%)    Monocytes Absolute 0.7  0.1 - 1.0 (K/uL)    Eosinophils Relative 0  0 - 5 (%)    Eosinophils Absolute 0.0  0.0 - 0.7 (K/uL)    Basophils Relative 0  0 - 1 (%)    Basophils Absolute 0.0  0.0 - 0.1 (K/uL)   BASIC METABOLIC PANEL     Status: Abnormal   Collection Time   06/03/11 10:04 PM      Component Value Range Comment   Sodium 137  135 -  145 (mEq/L)    Potassium 3.9  3.5 - 5.1 (mEq/L)    Chloride 99  96 - 112 (mEq/L)    CO2 30  19 - 32 (mEq/L)    Glucose, Bld 205 (*) 70 - 99 (mg/dL)    BUN 23  6 - 23 (mg/dL)    Creatinine, Ser 7.82  0.50 - 1.10 (mg/dL)    Calcium 9.3  8.4 - 10.5 (mg/dL)    GFR calc non Af Amer 56 (*) >90 (mL/min)    GFR calc Af Amer 65 (*) >90 (mL/min)   CARDIAC PANEL(CRET KIN+CKTOT+MB+TROPI)     Status: Abnormal   Collection Time   06/03/11 10:04 PM      Component Value Range Comment   Total CK 207 (*) 7 - 177 (U/L)    CK, MB 2.6  0.3 - 4.0 (ng/mL)    Troponin I <0.30  <0.30 (ng/mL)    Relative Index 1.3  0.0 - 2.5    POCT I-STAT TROPONIN I     Status: Normal   Collection Time   06/03/11 10:16 PM      Component Value Range Comment   Troponin i, poc 0.00  0.00 - 0.08 (ng/mL)    Comment 3             Dg Chest Portable 1 View  06/03/2011  *RADIOLOGY REPORT*  Clinical Data: Chest pain today.  PORTABLE CHEST - 1 VIEW  Comparison: None.  Findings: Patient has had prior cervical fusion.  The heart is enlarged.  There are no focal consolidations or pleural effusions. Degenerative changes are seen in the spine.  The patient has a deformity of the right proximal humerus.  IMPRESSION:  1.  Cardiomegaly without evidence for pulmonary edema. 2. No focal pulmonary abnormality.  Original Report Authenticated By: Patterson Hammersmith, M.D.      Assessment Present on Admission:  .Chest pain .Bradycardia .HTN (hypertension), benign .DM type 2 (diabetes mellitus, type 2) .Diabetic  neuropathy .Edema of both legs .Hard of hearing   PLAN: Because of her risk factors and because she lives alone will bring this lady on observation for cardiac rule out; because of her unexplained bradycardia we'll consult the cardiologist for assistance with management; the bradycardia may possibly be due to the Aricept; during the interview we found no evidence of dementia in this lady.  We'll get a d-dimer, as despite her bradycardia there is a remote possibility this chest pain is due to pulmonary embolus, if d-dimer is positive will get lower extremity Dopplers.  Continue empiric management of her other problems Other plans as per orders.   Kelten Enochs 06/04/2011, 3:38 AM

## 2011-06-04 NOTE — ED Notes (Signed)
Pt sleeping, resp even and unlabored. Vital signs stable.

## 2011-06-04 NOTE — Consult Note (Signed)
CARDIOLOGY CONSULT NOTE  Patient ID: SORIYA WORSTER MRN: [id number] DOB/AGE: 1931/10/05 77 y.o.  Admit date: 06/03/2011 Referring Physician: PTH Primary Physician: Mirna Mires Primary Cardiologist: Dietrich Pates (new) Reason for Consultation: Chest Pain and bradycardia  HPI: Mrs. Roundtree is a 77 year old patient of Dr. Stefano Gaul and hospitalist service we are asked to see in the setting of chest pain and bradycardia the patient has not been seen by cardiology in the past and has had no prior cardiac stress testing or echocardiogram. The patient states that the symptoms began a week ago at rest as a substernal ache while watching television, lasting on and off for one hour and then resolving spontaneously. It reoccurred again just prior to admission prompting her to call a friend who arranged for transport to the emergency department. She states that she was at rest again when she fell a substernal ache in the middle of her chest lasting again on an off for one hour. The patient did not have any associated shortness of breath dizziness nausea vomiting or diaphoresis the pain did not radiate to the back, jaw or arms. However after speaking with family members she was advised to call EMS and was brought to the emergency room. On arrival to the emergency room the patient's was found to have a blood pressure of 135/95 with a heart rate of 70. EKG was negative for ischemia. The patient was admitted to rule out myocardial infarction. The patient was also noted to be mildly bradycardic with heart rate in the 60s. We are asked for further recommendations concerning this. She did not see Dr. Loleta Chance prior to this admission.   Since admission to the hospital, she has had no recurrence of symptoms.  Review of systems complete and found to be negative unless listed above   Past Medical History  Diagnosis Date  . Diabetes mellitus   . Hypertension   . Hypercholesteremia   . Neuropathy     Family History  Problem  Relation Age of Onset  . Stroke Sister   . Heart failure Sister   . Diabetes Sister   . Cancer Brother     History   Social History  . Marital Status: Single    Spouse Name: N/A    Number of Children: N/A  . Years of Education: N/A   Occupational History  . Housekeeper     Retired  . Psychiatric nurse     Retired   Social History Main Topics  . Smoking status: Never Smoker   . Smokeless tobacco: Never Used  . Alcohol Use: No  . Drug Use: No  . Sexually Active: Not Currently   Other Topics Concern  . Not on file   Social History Narrative   [place]    Past Surgical History  Procedure Date  . Abdominal hysterectomy   . Appendectomy      Prescriptions prior to admission  Medication Sig Dispense Refill  . Aliskiren-Valsartan (VALTURNA) 150-160 MG TABS Take 1 tablet by mouth daily. For bloodpressure       . aspirin 325 MG tablet Take 325 mg by mouth daily.        Marland Kitchen donepezil (ARICEPT) 10 MG tablet Take 10 mg by mouth at bedtime as needed.        . furosemide (LASIX) 20 MG tablet Take 20 mg by mouth as directed. Take one tablet on Mondays, Wednesdays, Fridays, Saturdays, and Sundays. Take two tablets on Tuesdays and Thursdays       .  gabapentin (NEURONTIN) 300 MG capsule Take 300 mg by mouth every evening. For left foot pain       . glipiZIDE (GLUCOTROL XL) 10 MG 24 hr tablet Take 10 mg by mouth daily.        . memantine (NAMENDA) 10 MG tablet Take 10 mg by mouth 2 (two) times daily.        . Multiple Vitamins-Minerals (CENTRUM SILVER ULTRA WOMENS PO) Take 1 tablet by mouth daily.        . pioglitazone (ACTOS) 15 MG tablet Take 15 mg by mouth daily.        . polyethylene glycol powder (GLYCOLAX/MIRALAX) powder Take 17 g by mouth daily as needed. For constipation       . potassium chloride SA (K-DUR,KLOR-CON) 20 MEQ tablet Take 20 mEq by mouth as directed. Take one tablet by mouth on Mondays, Wednesdays, Fridays, Saturdays, and Sundays,  and take two tablets on Tuesdays and Thursdays         Physical Exam: Blood pressure 164/67, pulse 51, temperature 97.9 F (36.6 C), temperature source Oral, resp. rate 19, height 5\' 8"  (1.727 m), weight 188 lb 4.4 oz (85.4 kg), SpO2 98.00%.  Body mass index is 28.63 kg/(m^2).   General: Well developed, well nourished, in no acute distress; mildly overweight Head: Eyes PERRLA, No xanthomas. Right eye exotropia, and ptosis noted. Normal cephalic and atramatic Neck:  Left greater than right carotid bruit  Lungs: Clear bilaterally to auscultation and percussion. Heart: HRRR S1 S2, 1/6 systolic murmur, RSB with radiation to the carotids versus carotid bruits.  Pulses are 2+ & equal. No JVD.  No abdominal bruits. No femoral bruits. Abdomen: Bowel sounds are positive, abdomen soft and non-tender without masses or                  Hernia's noted. Msk:  Back normal, normal gait. Normal strength and tone for age. Extremities: No clubbing, cyanosis or edema.  DP +1 Neuro: Alert and oriented X 3. Psych:  Good affect, responds appropriately  Labs:   Lab Results  Component Value Date   WBC 5.8 06/04/2011   HGB 10.6* 06/04/2011   HCT 30.4* 06/04/2011   MCV 86.1 06/04/2011   PLT 267 06/04/2011     Lab 06/04/11 0904 06/04/11 0607 06/03/11 2204  NA -- -- 137  K -- -- 3.9  CL -- -- 99  CO2 -- -- 30  BUN -- -- 23  CREATININE 0.83 -- --  CALCIUM -- -- 9.3  PROT -- 6.5 --  BILITOT -- 0.3 --  ALKPHOS -- 89 --  ALT -- 15 --  AST -- 23 --  GLUCOSE -- -- 205*   Lab Results  Component Value Date   CKTOTAL 192* 06/04/2011   CKMB 2.7 06/04/2011   TROPONINI <0.30 06/04/2011    Lab Results  Component Value Date   CHOL 135 06/04/2011   Lab Results  Component Value Date   HDL 90 06/04/2011   Lab Results  Component Value Date   LDLCALC 38 06/04/2011   Lab Results  Component Value Date   TRIG 34 06/04/2011   Lab Results  Component Value Date   CHOLHDL 1.5 06/04/2011        Radiology:  IMPRESSION: 1. Cardiomegaly without evidence for pulmonary edema. 2. No focal pulmonary abnormality.  EKG:NSR rate of 69 bpm.  No abnormalities.  ASSESSMENT AND PLAN:  1. Chest Pain: This is has typical and atypical features. Occuring at rest. Cardiac  enzymes are found to be negative.  EKG is normal.  This may be GI in origin. Would recommend echocardiogram for LV fx in the setting of hypertension. Lexiscan nuclear study will be completed in am for diagnostic/prognostic purposes.  Will add PPI.  2. Bradycardia: HR range from telemetry demonstrates rates from 40's to 60's with average heart rate in the 50's. Will check TSH for evaluation. She is not on any rate lowering medications prior to admission.    Signed: Bettey Mare. Lyman Bishop NP [facility] 06/04/2011, 5:19 PM  Cardiology Attending  Patient interviewed and examined. Discussed with Joni Reining, NP.  Above note annotated and modified based upon my findings.   Chest discomfort is of some concern, but negative cardiac markers and normal EKG with symptoms are reassuring. We will proceed with a stress nuclear study to exclude significant coronary disease. Carotid ultrasound will be performed to evaluate bruits. Cardiovascular risk factors should be optimally treated-we will assist with modification of her medical regime.  [place] Bing, MD

## 2011-06-04 NOTE — ED Notes (Signed)
Lab called to check on results. Was advised it was going to be a few minutes.

## 2011-06-04 NOTE — Progress Notes (Signed)
Pt ordered ct angio chest to r/o PE unable to obtain 20g iv cath for contrast.  Xray dept made aware, suggested VQ scan if ok with md.  Dr. Sherrie Mustache made aware of situation orders given for VQ scan.

## 2011-06-04 NOTE — ED Notes (Signed)
Report given to Geoffery Spruce, RN on Dept 300.  States bed is being cleaned at this time.  Will call when room is ready.

## 2011-06-04 NOTE — Progress Notes (Signed)
Subjective: The patient states that she has had chest pain on and off since Sunday night. She denies any associated cough chills or shortness of breath. She does not have a history of coronary artery disease or any other cardiac history. She denies any orthopnea paroxysmal nocturnal dyspnea or dependent edema  Objective: Vital signs in last 24 hours: Filed Vitals:   06/04/11 0710 06/04/11 0900 06/04/11 1100 06/04/11 1247  BP:  144/49 127/42 164/67  Pulse:  66 62 51  Temp: 98.2 F (36.8 C)   97.9 F (36.6 C)  TempSrc: Oral   Oral  Resp:  20 19 19   Height:    5\' 8"  (1.727 m)  Weight:    85.4 kg (188 lb 4.4 oz)  SpO2:  98% 99% 98%    Intake/Output Summary (Last 24 hours) at 06/04/11 1437 Last data filed at 06/04/11 1010  Gross per 24 hour  Intake    560 ml  Output    425 ml  Net    135 ml    Weight change:   General: Alert, awake, oriented x3, in no acute distress. HEENT: No bruits, no goiter. Heart: Regular rate and rhythm, without murmurs, rubs, gallops. Lungs: Clear to auscultation bilaterally. Abdomen: Soft, nontender, nondistended, positive bowel sounds. Extremities: No clubbing cyanosis or edema with positive pedal pulses. Neuro: Grossly intact, nonfocal.    Lab Results: Results for orders placed during the hospital encounter of 06/03/11 (from the past 24 hour(s))  CBC     Status: Abnormal   Collection Time   06/03/11 10:04 PM      Component Value Range   WBC 7.7  4.0 - 10.5 (K/uL)   RBC 3.49 (*) 3.87 - 5.11 (MIL/uL)   Hemoglobin 10.5 (*) 12.0 - 15.0 (g/dL)   HCT 16.1 (*) 09.6 - 46.0 (%)   MCV 86.0  78.0 - 100.0 (fL)   MCH 30.1  26.0 - 34.0 (pg)   MCHC 35.0  30.0 - 36.0 (g/dL)   RDW 04.5  40.9 - 81.1 (%)   Platelets 265  150 - 400 (K/uL)  DIFFERENTIAL     Status: Normal   Collection Time   06/03/11 10:04 PM      Component Value Range   Neutrophils Relative 72  43 - 77 (%)   Neutro Abs 5.6  1.7 - 7.7 (K/uL)   Lymphocytes Relative 18  12 - 46 (%)   Lymphs  Abs 1.4  0.7 - 4.0 (K/uL)   Monocytes Relative 10  3 - 12 (%)   Monocytes Absolute 0.7  0.1 - 1.0 (K/uL)   Eosinophils Relative 0  0 - 5 (%)   Eosinophils Absolute 0.0  0.0 - 0.7 (K/uL)   Basophils Relative 0  0 - 1 (%)   Basophils Absolute 0.0  0.0 - 0.1 (K/uL)  BASIC METABOLIC PANEL     Status: Abnormal   Collection Time   06/03/11 10:04 PM      Component Value Range   Sodium 137  135 - 145 (mEq/L)   Potassium 3.9  3.5 - 5.1 (mEq/L)   Chloride 99  96 - 112 (mEq/L)   CO2 30  19 - 32 (mEq/L)   Glucose, Bld 205 (*) 70 - 99 (mg/dL)   BUN 23  6 - 23 (mg/dL)   Creatinine, Ser 9.14  0.50 - 1.10 (mg/dL)   Calcium 9.3  8.4 - 78.2 (mg/dL)   GFR calc non Af Amer 56 (*) >90 (mL/min)   GFR calc  Af Amer 65 (*) >90 (mL/min)  CARDIAC PANEL(CRET KIN+CKTOT+MB+TROPI)     Status: Abnormal   Collection Time   06/03/11 10:04 PM      Component Value Range   Total CK 207 (*) 7 - 177 (U/L)   CK, MB 2.6  0.3 - 4.0 (ng/mL)   Troponin I <0.30  <0.30 (ng/mL)   Relative Index 1.3  0.0 - 2.5   POCT I-STAT TROPONIN I     Status: Normal   Collection Time   06/03/11 10:16 PM      Component Value Range   Troponin i, poc 0.00  0.00 - 0.08 (ng/mL)   Comment 3           IRON AND TIBC     Status: Abnormal   Collection Time   06/04/11  3:00 AM      Component Value Range   Iron 39 (*) 42 - 135 (ug/dL)   TIBC 161  096 - 045 (ug/dL)   Saturation Ratios 11 (*) 20 - 55 (%)   UIBC 319  125 - 400 (ug/dL)  RETICULOCYTES     Status: Abnormal   Collection Time   06/04/11  3:00 AM      Component Value Range   Retic Ct Pct 1.4  0.4 - 3.1 (%)   RBC. 3.49 (*) 3.87 - 5.11 (MIL/uL)   Retic Count, Manual 48.9  19.0 - 186.0 (K/uL)  D-DIMER, QUANTITATIVE     Status: Abnormal   Collection Time   06/04/11  3:00 AM      Component Value Range   D-Dimer, Quant 1.03 (*) 0.00 - 0.48 (ug/mL-FEU)  URINALYSIS, ROUTINE W REFLEX MICROSCOPIC     Status: Normal   Collection Time   06/04/11  5:59 AM      Component Value Range    Color, Urine YELLOW  YELLOW    Appearance CLEAR  CLEAR    Specific Gravity, Urine 1.020  1.005 - 1.030    pH 6.0  5.0 - 8.0    Glucose, UA NEGATIVE  NEGATIVE (mg/dL)   Hgb urine dipstick NEGATIVE  NEGATIVE    Bilirubin Urine NEGATIVE  NEGATIVE    Ketones, ur NEGATIVE  NEGATIVE (mg/dL)   Protein, ur NEGATIVE  NEGATIVE (mg/dL)   Urobilinogen, UA 0.2  0.0 - 1.0 (mg/dL)   Nitrite NEGATIVE  NEGATIVE    Leukocytes, UA NEGATIVE  NEGATIVE   HEPATIC FUNCTION PANEL     Status: Abnormal   Collection Time   06/04/11  6:07 AM      Component Value Range   Total Protein 6.5  6.0 - 8.3 (g/dL)   Albumin 3.0 (*) 3.5 - 5.2 (g/dL)   AST 23  0 - 37 (U/L)   ALT 15  0 - 35 (U/L)   Alkaline Phosphatase 89  39 - 117 (U/L)   Total Bilirubin 0.3  0.3 - 1.2 (mg/dL)   Bilirubin, Direct 0.1  0.0 - 0.3 (mg/dL)   Indirect Bilirubin 0.2 (*) 0.3 - 0.9 (mg/dL)  MAGNESIUM     Status: Normal   Collection Time   06/04/11  6:07 AM      Component Value Range   Magnesium 2.3  1.5 - 2.5 (mg/dL)  GLUCOSE, CAPILLARY     Status: Normal   Collection Time   06/04/11  7:02 AM      Component Value Range   Glucose-Capillary 92  70 - 99 (mg/dL)  CARDIAC PANEL(CRET KIN+CKTOT+MB+TROPI)     Status: Abnormal  Collection Time   06/04/11  9:04 AM      Component Value Range   Total CK 192 (*) 7 - 177 (U/L)   CK, MB 2.7  0.3 - 4.0 (ng/mL)   Troponin I <0.30  <0.30 (ng/mL)   Relative Index 1.4  0.0 - 2.5   CBC     Status: Abnormal   Collection Time   06/04/11  9:04 AM      Component Value Range   WBC 5.8  4.0 - 10.5 (K/uL)   RBC 3.53 (*) 3.87 - 5.11 (MIL/uL)   Hemoglobin 10.6 (*) 12.0 - 15.0 (g/dL)   HCT 16.1 (*) 09.6 - 46.0 (%)   MCV 86.1  78.0 - 100.0 (fL)   MCH 30.0  26.0 - 34.0 (pg)   MCHC 34.9  30.0 - 36.0 (g/dL)   RDW 04.5  40.9 - 81.1 (%)   Platelets 267  150 - 400 (K/uL)  CREATININE, SERUM     Status: Abnormal   Collection Time   06/04/11  9:04 AM      Component Value Range   Creatinine, Ser 0.83  0.50 -  1.10 (mg/dL)   GFR calc non Af Amer 66 (*) >90 (mL/min)   GFR calc Af Amer 77 (*) >90 (mL/min)    Micro: No results found for this or any previous visit (from the past 240 hour(s)).  Studies/Results: US Venous Img Lower Bilateral  06/04/2011  *RADIOLOGY REPORT*  IMPRESSION: No evidence of bilateral lower extremity deep vein thrombosis.  Original Report Authenticated By: Richarda Overlie, M.D.   Dg Chest Portable 1 View  06/03/2011  *RADIOLOGY REPORT*    IMPRESSION:  1.  Cardiomegaly without evidence for pulmonary edema. 2. No focal pulmonary abnormality.  Original Report Authenticated By: Patterson Hammersmith, M.D.    Medications.     Marland Kitchen acetaminophen  650 mg Oral Once  . aliskiren  150 mg Oral Daily   And  . olmesartan  20 mg Oral Daily  . aspirin  324 mg Oral Once  . aspirin  325 mg Oral Daily  . donepezil  10 mg Oral QHS  . enoxaparin  40 mg Subcutaneous Q24H  . furosemide  20 mg Oral Custom   And  . furosemide  40 mg Oral Custom  . gabapentin  300 mg Oral QPM  . glipiZIDE  10 mg Oral Daily  . insulin aspart  0-5 Units Subcutaneous QHS  . insulin aspart  0-9 Units Subcutaneous TID WC  . memantine  10 mg Oral BID  . potassium chloride  20 mEq Oral Custom   And  . potassium chloride  40 mEq Oral Custom  . DISCONTD: Aliskiren-Valsartan  1 tablet Oral Daily  . DISCONTD: furosemide  20 mg Oral UD  . DISCONTD: potassium chloride SA  20 mEq Oral UD     Assessment:Plan Principal Problem:  Chest pain: This does not appear to be cardiac in a somewhat atypical for coronary artery disease. Given her elevated d-dimer, we'll do a CT angiography chest to rule out a pulmonary embolism. Will also obtain 2-D echo to rule out any wall motion abnormalities. Given her multiple risk factors a cardiology consultation has been obtained. She will be continued on aspirin.    Bradycardia: This could be secondary to Aricept  HTN (hypertension), benign  DM type 2 (diabetes mellitus, type 2): Continue  the patient on glipizide and sliding scale insulin  Diabetic neuropathy: Continue the patient on Neurontin.  Edema of both  legs:  Hard of hearing:         LOS: 1 day   [facility] 06/04/2011, 2:37 PM

## 2011-06-05 ENCOUNTER — Observation Stay (HOSPITAL_COMMUNITY): Payer: Medicare Other

## 2011-06-05 DIAGNOSIS — R079 Chest pain, unspecified: Secondary | ICD-10-CM

## 2011-06-05 LAB — CBC
MCH: 30.2 pg (ref 26.0–34.0)
MCHC: 35.2 g/dL (ref 30.0–36.0)
MCV: 85.8 fL (ref 78.0–100.0)
Platelets: 253 10*3/uL (ref 150–400)

## 2011-06-05 LAB — URINE CULTURE
Colony Count: NO GROWTH
Culture  Setup Time: 201210231230

## 2011-06-05 LAB — GLUCOSE, CAPILLARY
Glucose-Capillary: 112 mg/dL — ABNORMAL HIGH (ref 70–99)
Glucose-Capillary: 215 mg/dL — ABNORMAL HIGH (ref 70–99)

## 2011-06-05 LAB — BASIC METABOLIC PANEL
BUN: 12 mg/dL (ref 6–23)
Calcium: 9 mg/dL (ref 8.4–10.5)
GFR calc non Af Amer: 82 mL/min — ABNORMAL LOW (ref >90)
Glucose, Bld: 104 mg/dL — ABNORMAL HIGH (ref 70–99)

## 2011-06-05 MED ORDER — OLMESARTAN MEDOXOMIL 20 MG PO TABS
40.0000 mg | ORAL_TABLET | Freq: Every day | ORAL | Status: DC
  Administered 2011-06-06 – 2011-06-07 (×2): 40 mg via ORAL
  Filled 2011-06-05 (×2): qty 2

## 2011-06-05 NOTE — Progress Notes (Signed)
Subjective: 77 year-old with chest pain currently resolved. Patient had a VQ scan which interferes with a stress test versus a stress test had to be canceled and reorder tomorrow morning. Objective: Filed Vitals:   06/04/11 2035 06/04/11 2256 06/05/11 0704 06/05/11 1011  BP:  125/74 166/67   Pulse:  55 65 61  Temp:  97.8 F (36.6 C) 97.9 F (36.6 C)   TempSrc:   Oral   Resp:  20 18   Height:      Weight:      SpO2: 98% 96% 100% 99%   Weight change: 1.91 kg (4 lb 3.4 oz)  Intake/Output Summary (Last 24 hours) at 06/05/11 1446 Last data filed at 06/04/11 1808  Gross per 24 hour  Intake    390 ml  Output      0 ml  Net    390 ml    General: Alert, awake, oriented x3, in no acute distress.  HEENT: No bruits, no goiter.  Heart: Regular rate and rhythm, without murmurs, rubs, gallops.  Lungs: Good air movement and clear bilateral.  Abdomen: Soft, nontender, nondistended, positive bowel sounds.  Neuro: Grossly intact, nonfocal.   Lab Results:  Basename 06/05/11 0549 06/04/11 0904 06/04/11 0607 06/03/11 2204  NA 139 -- -- 137  K 3.8 -- -- 3.9  CL 102 -- -- 99  CO2 29 -- -- 30  GLUCOSE 104* -- -- 205*  BUN 12 -- -- 23  CREATININE 0.69 0.83 -- --  CALCIUM 9.0 -- -- 9.3  MG -- -- 2.3 --  PHOS -- -- -- --    Basename 06/04/11 0607  AST 23  ALT 15  ALKPHOS 89  BILITOT 0.3  PROT 6.5  ALBUMIN 3.0*     Basename 06/05/11 0549 06/04/11 0904 06/03/11 2204  WBC 6.7 5.8 --  NEUTROABS -- -- 5.6  HGB 11.1* 10.6* --  HCT 31.5* 30.4* --  MCV 85.8 86.1 --  PLT 253 267 --    Basename 06/04/11 0904 06/03/11 2204  CKTOTAL 192* 207*  CKMB 2.7 2.6  CKMBINDEX -- --  TROPONINI <0.30 <0.30     Basename 06/04/11 0300  DDIMER 1.03*    Basename 06/04/11 0607  HGBA1C 6.0*    Basename 06/04/11 0904  CHOL 135  HDL 90  LDLCALC 38  TRIG 34  CHOLHDL 1.5  LDLDIRECT --    Basename 06/04/11 0607  TSH 3.649  T4TOTAL --  T3FREE --  THYROIDAB --    Basename 06/04/11  0300  VITAMINB12 776  FOLATE >20.0  FERRITIN 9*  TIBC 358  IRON 39*  RETICCTPCT 1.4    Micro Results: No results found for this or any previous visit (from the past 240 hour(s)).  Studies/Results: Nm Pulmonary Per & Vent  06/04/2011  *RADIOLOGY REPORT*  Clinical Data: Chest pain.  NM PULMONARY VENTILATION AND PERFUSION SCAN  Radiopharmaceutical: 9.3 mCi xenon 133 by inhalation.  5.0 mCi technetium MAA IV.  Comparison: Chest x-ray dated 06/03/2011  Findings: Ventilation study shows some mild diffuse air trapping bilaterally.  Perfusion study demonstrates no segmental or subsegmental perfusion abnormalities.  IMPRESSION: No evidence of pulmonary emboli.  Slight diffuse air trapping.  Original Report Authenticated By: Gwynn Burly, M.D.   US Venous Img Lower Bilateral  06/04/2011  *RADIOLOGY REPORT*  Clinical Data: Bilateral leg pain and edema.  BILATERAL LOWER EXTREMITY VENOUS DUPLEX ULTRASOUND  Technique:  Gray-scale sonography with graded compression, as well as color Doppler and duplex ultrasound, were performed to  evaluate the deep venous system of both lower extremities from the level of the common femoral vein through the popliteal and proximal calf veins.  Spectral Doppler was utilized to evaluate flow at rest and with distal augmentation maneuvers.  Comparison:  None.  Findings:  Normal compressibility of bilateral common femoral, superficial femoral, and popliteal veins is demonstrated, as well as the visualized proximal calf veins.  No filling defects to suggest DVT on grayscale or color Doppler imaging.  Doppler waveforms show normal direction of venous flow, normal respiratory phasicity and response to augmentation.  IMPRESSION: No evidence of bilateral lower extremity deep vein thrombosis.  Original Report Authenticated By: Richarda Overlie, M.D.   Dg Chest Portable 1 View  06/03/2011  *RADIOLOGY REPORT*  Clinical Data: Chest pain today.  PORTABLE CHEST - 1 VIEW  Comparison: None.   Findings: Patient has had prior cervical fusion.  The heart is enlarged.  There are no focal consolidations or pleural effusions. Degenerative changes are seen in the spine.  The patient has a deformity of the right proximal humerus.  IMPRESSION:  1.  Cardiomegaly without evidence for pulmonary edema. 2. No focal pulmonary abnormality.  Original Report Authenticated By: Patterson Hammersmith, M.D.    Medications: I have reviewed the patient's current medications.   Patient Active Hospital Problem List: 1.Chest pain (06/04/2011)  patient currently chest pain-free. Stress nuclear stress test will be ordered for tomorrow morning. As she just had a VQ scan to rule out a PE which was negative. and this will interfere with stress test.  2.Bradycardia (06/04/2011)  asymptomatic currently stable.   3.HTN (hypertension), benign (06/04/2011)   borderline high continue to monitor.   4.DM type 2 (diabetes mellitus, type 2) (06/04/2011)   good control continue current management   5.Diabetic neuropathy (06/04/2011)  patient has no complaints. Continue Neurontin.    LOS: 2 days   FELIZ ORTIZ, [place] 06/05/2011, 2:46 PM

## 2011-06-05 NOTE — Progress Notes (Signed)
UR chart review completed.  

## 2011-06-05 NOTE — Progress Notes (Signed)
Patient seen and examined. Reviewed Ms. Caitlin Kline findings. No recurrent chest pain and cardiac markers are reassuring. D-dimer 1.0 but VQ scan showed no clear evidence of pulmonary embolus with air trapping. LE Dopplers showed no DVT. Due to proximity of VQ scan will have to delay Myoview until tomorrow. Can followup with further recommendations after study.

## 2011-06-05 NOTE — Progress Notes (Signed)
Subjective:  No further chest pain. Stress test had to be rescheduled for tomorrow because of her VQ scan  Objective:  Vital Signs in the last 24 hours: Temp:  [97.8 F (36.6 C)-97.9 F (36.6 C)] 97.9 F (36.6 C) (10/24 0704) Pulse Rate:  [51-65] 61  (10/24 1011) Resp:  [18-20] 18  (10/24 0704) BP: (125-166)/(42-80) 166/67 mmHg (10/24 0704) SpO2:  [94 %-100 %] 99 % (10/24 1011) Weight:  [188 lb 4.4 oz (85.4 kg)] 188 lb 4.4 oz (85.4 kg) (10/23 1247)  Intake/Output from previous day: 10/23 0701 - 10/24 0700 In: 950 [P.O.:950] Out: 425 [Urine:425] Intake/Output from this shift:    Physical Exam: NECK: Without JVD, HJR, or bruit LUNGS: Clear anterior, posterior, lateral HEART: Regular rate and rhythm, no murmur, gallop, rub, bruit, thrill, or heave EXTREMITIES: Without cyanosis, clubbing, or edema   Lab Results:  [facility] 06/05/11 0549 06/04/11 0904  WBC 6.7 5.8  HGB 11.1* 10.6*  PLT 253 267    Basename 06/05/11 0549 06/04/11 0904 06/03/11 2204  NA 139 -- 137  K 3.8 -- 3.9  CL 102 -- 99  CO2 29 -- 30  GLUCOSE 104* -- 205*  BUN 12 -- 23  CREATININE 0.69 0.83 --    Basename 06/04/11 0904 06/03/11 2204  TROPONINI <0.30 <0.30   Hepatic Function Panel  Basename 06/04/11 0607  PROT 6.5  ALBUMIN 3.0*  AST 23  ALT 15  ALKPHOS 89  BILITOT 0.3  BILIDIR 0.1  IBILI 0.2*    Basename 06/04/11 0904  CHOL 135   No results found for this basename: PROTIME in the last 72 hours  Imaging:  NM Pulmonary Per & Vent   IMPRESSION: No evidence of pulmonary emboli. Slight diffuse air trapping.   Assessment/Plan:  Chest pain: patient denies any further chest pain. No evidence of pulmonary embolism VQ scan. Will have stress Myoview tomorrow. Sinus bradycardia: stable Hypertension  LOS: 2 days    Jacolyn Reedy 06/05/2011, 10:45 AM

## 2011-06-06 ENCOUNTER — Observation Stay (HOSPITAL_COMMUNITY): Payer: Medicare Other

## 2011-06-06 ENCOUNTER — Encounter (HOSPITAL_COMMUNITY): Payer: Self-pay | Admitting: Cardiology

## 2011-06-06 ENCOUNTER — Encounter (HOSPITAL_COMMUNITY): Payer: Self-pay

## 2011-06-06 LAB — GLUCOSE, CAPILLARY: Glucose-Capillary: 192 mg/dL — ABNORMAL HIGH (ref 70–99)

## 2011-06-06 LAB — BASIC METABOLIC PANEL
BUN: 13 mg/dL (ref 6–23)
CO2: 28 mEq/L (ref 19–32)
Calcium: 8.7 mg/dL (ref 8.4–10.5)
Creatinine, Ser: 0.71 mg/dL (ref 0.50–1.10)
Glucose, Bld: 102 mg/dL — ABNORMAL HIGH (ref 70–99)
Sodium: 138 mEq/L (ref 135–145)

## 2011-06-06 MED ORDER — TECHNETIUM TC 99M TETROFOSMIN IV KIT
10.0000 | PACK | Freq: Once | INTRAVENOUS | Status: AC | PRN
  Administered 2011-06-06: 10 via INTRAVENOUS

## 2011-06-06 MED ORDER — SODIUM CHLORIDE 0.9 % IJ SOLN
INTRAMUSCULAR | Status: AC
  Administered 2011-06-06: 10 mL via INTRAVENOUS
  Filled 2011-06-06: qty 10

## 2011-06-06 MED ORDER — SODIUM CHLORIDE 0.9 % IJ SOLN
INTRAMUSCULAR | Status: AC
  Filled 2011-06-06: qty 10

## 2011-06-06 MED ORDER — AMLODIPINE BESYLATE 5 MG PO TABS
5.0000 mg | ORAL_TABLET | Freq: Every day | ORAL | Status: DC
  Administered 2011-06-06 – 2011-06-07 (×2): 5 mg via ORAL
  Filled 2011-06-06 (×2): qty 1

## 2011-06-06 MED ORDER — TECHNETIUM TC 99M TETROFOSMIN IV KIT
30.0000 | PACK | Freq: Once | INTRAVENOUS | Status: AC | PRN
  Administered 2011-06-06: 30 via INTRAVENOUS

## 2011-06-06 MED ORDER — REGADENOSON 0.4 MG/5ML IV SOLN
INTRAVENOUS | Status: AC
  Administered 2011-06-06: 0.4 mg via INTRAVENOUS
  Filled 2011-06-06: qty 5

## 2011-06-06 NOTE — Progress Notes (Signed)
Stress Lab Nurses Notes - Akirra Lacerda 06/06/2011  Reason for doing test: Chest Pain and bradycardia  Type of test: Steffanie Dunn  Nurse performing test: Parke Poisson, RN  Nuclear Medicine Tech: Lyndel Pleasure  Echo Tech: Not Applicable  MD performing test: R. Rothbart  Family MD: [facility]  Test explained and consent signed: yes  IV started: 20g jelco, Saline lock flushed, No redness or edema and Saline lock from floor  Symptoms: None  Treatment/Intervention: None  Reason test stopped: protocol completed  After recovery IV was: No redness or edema and Saline Lock flushed  Patient to return to Nuc. Med at :11:15 am  Patient discharged: Transported back to room 337 via WC  Patient's Condition upon discharge was: stable  Comments: During test BP 148/72 & HR 80 .  Recovery BP 132/62 & HR 64 .  No symptoms noted. Erskine Speed T

## 2011-06-06 NOTE — Progress Notes (Signed)
Subjective: 77 year-old with chest pain currently resolved. Patient had a VQ scan which interferes with a stress test, stress test had to be canceled and reorder tomorrow morning. Objective: Filed Vitals:   06/05/11 2300 06/06/11 0230 06/06/11 0630 06/06/11 0700  BP: 153/68 166/72 169/66   Pulse: 56 60 53   Temp: 98.1 F (36.7 C) 98.4 F (36.9 C) 98.4 F (36.9 C)   TempSrc:      Resp: 20 20 20    Height:      Weight:    83.915 kg (185 lb)  SpO2: 100% 98% 97%    Weight change: -1.485 kg (-3 lb 4.4 oz)  Intake/Output Summary (Last 24 hours) at 06/06/11 1057 Last data filed at 06/06/11 0600  Gross per 24 hour  Intake    420 ml  Output    500 ml  Net    -80 ml    General: Alert, awake, oriented x3, in no acute distress.  HEENT: No bruits, no goiter.  Heart: Regular rate and rhythm, without murmurs, rubs, gallops.  Lungs: Good air movement and clear bilateral.  Abdomen: Soft, nontender, nondistended, positive bowel sounds.  Neuro: Grossly intact, nonfocal.   Lab Results:  Basename 06/06/11 0446 06/05/11 0549 06/04/11 0607  NA 138 139 --  K 3.7 3.8 --  CL 103 102 --  CO2 28 29 --  GLUCOSE 102* 104* --  BUN 13 12 --  CREATININE 0.71 0.69 --  CALCIUM 8.7 9.0 --  MG -- -- 2.3  PHOS -- -- --    Basename 06/04/11 0607  AST 23  ALT 15  ALKPHOS 89  BILITOT 0.3  PROT 6.5  ALBUMIN 3.0*     Basename 06/05/11 0549 06/04/11 0904 06/03/11 2204  WBC 6.7 5.8 --  NEUTROABS -- -- 5.6  HGB 11.1* 10.6* --  HCT 31.5* 30.4* --  MCV 85.8 86.1 --  PLT 253 267 --    Basename 06/04/11 0904 06/03/11 2204  CKTOTAL 192* 207*  CKMB 2.7 2.6  CKMBINDEX -- --  TROPONINI <0.30 <0.30     Basename 06/04/11 0300  DDIMER 1.03*    Basename 06/04/11 0607  HGBA1C 6.0*    Basename 06/04/11 0904  CHOL 135  HDL 90  LDLCALC 38  TRIG 34  CHOLHDL 1.5  LDLDIRECT --    Basename 06/04/11 0607  TSH 3.649  T4TOTAL --  T3FREE --  THYROIDAB --    Basename 06/04/11 0300    VITAMINB12 776  FOLATE >20.0  FERRITIN 9*  TIBC 358  IRON 39*  RETICCTPCT 1.4    Micro Results: Recent Results (from the past 240 hour(s))  URINE CULTURE     Status: Normal   Collection Time   06/04/11  5:59 AM      Component Value Range Status Comment   Specimen Description URINE, CLEAN CATCH   Final    Special Requests NONE   Final    Setup Time [id number]   Final    Colony Count NO GROWTH   Final    Culture NO GROWTH   Final    Report Status 06/05/2011 FINAL   Final     Studies/Results: US Carotid Duplex Bilateral  06/05/2011  *RADIOLOGY REPORT*  Clinical Data: Bruit  BILATERAL CAROTID DUPLEX ULTRASOUND  Technique: Wallace Cullens scale imaging, color Doppler and duplex ultrasound was performed of bilateral carotid and vertebral arteries in the neck.  Comparison:  None.  Criteria:  Quantification of carotid stenosis is based on velocity parameters that correlate  the residual internal carotid diameter with NASCET-based stenosis levels, using the diameter of the distal internal carotid lumen as the denominator for stenosis measurement.  The following velocity measurements were obtained:                   PEAK SYSTOLIC/END DIASTOLIC RIGHT ICA:                        96cm/sec CCA:                        133cm/sec SYSTOLIC ICA/CCA RATIO:     0.72 DIASTOLIC ICA/CCA RATIO: ECA:                        60cm/sec  LEFT ICA:                        87cm/sec CCA:                        198cm/sec SYSTOLIC ICA/CCA RATIO:     0.44 DIASTOLIC ICA/CCA RATIO: ECA:                        116cm/sec  Findings:  RIGHT CAROTID ARTERY: No significant plaque in the bulb.  Normal- appearing low resistance internal carotid Doppler wave form.  RIGHT VERTEBRAL ARTERY:  Antegrade.  LEFT CAROTID ARTERY: Minimal plaque in the left internal carotid bulb.  Low resistance internal carotid Doppler wave form with some spectral broadening.  LEFT VERTEBRAL ARTERY:  Antegrade.  IMPRESSION: Less than 50% stenosis in the right and left  internal carotid arteries.  Original Report Authenticated By: Donavan Burnet, M.D.   Nm Pulmonary Per & Vent  06/04/2011  *RADIOLOGY REPORT*  Clinical Data: Chest pain.  NM PULMONARY VENTILATION AND PERFUSION SCAN  Radiopharmaceutical: 9.3 mCi xenon 133 by inhalation.  5.0 mCi technetium MAA IV.  Comparison: Chest x-ray dated 06/03/2011  Findings: Ventilation study shows some mild diffuse air trapping bilaterally.  Perfusion study demonstrates no segmental or subsegmental perfusion abnormalities.  IMPRESSION: No evidence of pulmonary emboli.  Slight diffuse air trapping.  Original Report Authenticated By: Gwynn Burly, M.D.    Medications: I have reviewed the patient's current medications.   Patient Active Hospital Problem List: 1.Chest pain (06/04/2011)  patient currently chest pain-free. Stress nuclear stress test will be ordered for tomorrow morning. As she just had a VQ scan to rule out a PE which was negative. and this will interfere with stress test. Stress is pending.   2.Bradycardia (06/04/2011)  asymptomatic currently stable.   3.HTN (hypertension), benign (06/04/2011)    high today, cannot use beta blocker due to her bradycardia we'll start her on Norvasc   4.DM type 2 (diabetes mellitus, type 2) (06/04/2011)   good control continue current management   5.Diabetic neuropathy (06/04/2011)  patient has no complaints. Continue Neurontin.    LOS: 3 days   FELIZ ORTIZ, Dinah Lupa 06/06/2011, 10:57 AM

## 2011-06-07 LAB — GLUCOSE, CAPILLARY: Glucose-Capillary: 102 mg/dL — ABNORMAL HIGH (ref 70–99)

## 2011-06-07 MED ORDER — AMLODIPINE BESYLATE 5 MG PO TABS: 5.0000 mg | ORAL_TABLET | Freq: Every day | ORAL | Status: DC

## 2011-06-07 MED ORDER — VALSARTAN 160 MG PO TABS: 160.0000 mg | ORAL_TABLET | Freq: Every day | ORAL | Status: DC

## 2011-06-07 NOTE — Discharge Planning (Signed)
SHIZA THELEN MRN: [id number] DOB/AGE: 75/19/1933 75 y.o.  Admit date: 06/03/2011 Discharge date: 06/07/2011  Primary Care Physician:  Evlyn Courier, MD   Discharge Diagnoses:   Patient Active Problem List  Diagnoses  . Chest pain  . Bradycardia  . HTN (hypertension), benign  . DM type 2 (diabetes mellitus, type 2)  . Diabetic neuropathy  . Edema of both legs  . Hard of hearing    DISCHARGE MEDICATION: Current Discharge Medication List    START taking these medications   Details  amLODipine (NORVASC) 5 MG tablet Take 1 tablet (5 mg total) by mouth daily. Qty: 30 tablet, Refills: 0      CONTINUE these medications which have NOT CHANGED   Details  aspirin 325 MG tablet Take 325 mg by mouth daily.      donepezil (ARICEPT) 10 MG tablet Take 10 mg by mouth at bedtime as needed.      furosemide (LASIX) 20 MG tablet Take 20 mg by mouth as directed. Take one tablet on Mondays, Wednesdays, Fridays, Saturdays, and Sundays. Take two tablets on Tuesdays and Thursdays     gabapentin (NEURONTIN) 300 MG capsule Take 300 mg by mouth every evening. For left foot pain     glipiZIDE (GLUCOTROL XL) 10 MG 24 hr tablet Take 10 mg by mouth daily.      memantine (NAMENDA) 10 MG tablet Take 10 mg by mouth 2 (two) times daily.      Multiple Vitamins-Minerals (CENTRUM SILVER ULTRA WOMENS PO) Take 1 tablet by mouth daily.      pioglitazone (ACTOS) 15 MG tablet Take 15 mg by mouth daily.      polyethylene glycol powder (GLYCOLAX/MIRALAX) powder Take 17 g by mouth daily as needed. For constipation     potassium chloride SA (K-DUR,KLOR-CON) 20 MEQ tablet Take 20 mEq by mouth as directed. Take one tablet by mouth on Mondays, Wednesdays, Fridays, Saturdays, and Sundays, and take two tablets on Tuesdays and Thursdays       STOP taking these medications     Aliskiren-Valsartan (VALTURNA) 150-160 MG TABS            Consults: Treatment Team:  Robert M. Rothbart, MD   SIGNIFICANT  DIAGNOSTIC STUDIES:  Us Carotid Duplex Bilateral  06/05/2011  *RADIOLOGY REPORT*  Clinical Data: Bruit  BILATERAL CAROTID DUPLEX ULTRASOUND  Technique: Gray scale imaging, color Doppler and duplex ultrasound was performed of bilateral carotid and vertebral arteries in the neck.  Comparison:  None.  Criteria:  Quantification of carotid stenosis is based on velocity parameters that correlate the residual internal carotid diameter with NASCET-based stenosis levels, using the diameter of the distal internal carotid lumen as the denominator for stenosis measurement.  The following velocity measurements were obtained:                   PEAK SYSTOLIC/END DIASTOLIC RIGHT ICA:                        96cm/sec CCA:                        133cm/sec SYSTOLIC ICA/CCA RATIO:     0.72 DIASTOLIC ICA/CCA RATIO: ECA:                        60 cm/sec  LEFT ICA:  87cm/sec CCA:                        198cm/sec SYSTOLIC ICA/CCA RATIO:     0.44 DIASTOLIC ICA/CCA RATIO: ECA:                        116cm/sec  Findings:  RIGHT CAROTID ARTERY: No significant plaque in the bulb.  Normal- appearing low resistance internal carotid Doppler wave form.  RIGHT VERTEBRAL ARTERY:  Antegrade.  LEFT CAROTID ARTERY: Minimal plaque in the left internal carotid bulb.  Low resistance internal carotid Doppler wave form with some spectral broadening.  LEFT VERTEBRAL ARTERY:  Antegrade.  IMPRESSION: Less than 50% stenosis in the right and left internal carotid arteries.  Original Report Authenticated By: Donavan Burnet, M.D.   Nm Pulmonary Per & Vent  06/04/2011  *RADIOLOGY REPORT*  Clinical Data: Chest pain.  NM PULMONARY VENTILATION AND PERFUSION SCAN  Radiopharmaceutical: 9.3 mCi xenon 133 by inhalation.  5.0 mCi technetium MAA IV.  Comparison: Chest x-ray dated 06/03/2011  Findings: Ventilation study shows some mild diffuse air trapping bilaterally.  Perfusion study demonstrates no segmental or subsegmental perfusion  abnormalities.  IMPRESSION: No evidence of pulmonary emboli.  Slight diffuse air trapping.  Original Report Authenticated By: Gwynn Burly, M.D.   US Venous Img Lower Bilateral  06/04/2011  *RADIOLOGY REPORT*  Clinical Data: Bilateral leg pain and edema.  BILATERAL LOWER EXTREMITY VENOUS DUPLEX ULTRASOUND  Technique:  Gray-scale sonography with graded compression, as well as color Doppler and duplex ultrasound, were performed to evaluate the deep venous system of both lower extremities from the level of the common femoral vein through the popliteal and proximal calf veins.  Spectral Doppler was utilized to evaluate flow at rest and with distal augmentation maneuvers.  Comparison:  None.  Findings:  Normal compressibility of bilateral common femoral, superficial femoral, and popliteal veins is demonstrated, as well as the visualized proximal calf veins.  No filling defects to suggest DVT on grayscale or color Doppler imaging.  Doppler waveforms show normal direction of venous flow, normal respiratory phasicity and response to augmentation.  IMPRESSION: No evidence of bilateral lower extremity deep vein thrombosis.  Original Report Authenticated By: Richarda Overlie, M.D.   Dg Chest Portable 1 View  06/03/2011  *RADIOLOGY REPORT*  Clinical Data: Chest pain today.  PORTABLE CHEST - 1 VIEW  Comparison: None.  Findings: Patient has had prior cervical fusion.  The heart is enlarged.  There are no focal consolidations or pleural effusions. Degenerative changes are seen in the spine.  The patient has a deformity of the right proximal humerus.  IMPRESSION:  1.  Cardiomegaly without evidence for pulmonary edema. 2. No focal pulmonary abnormality.  Original Report Authenticated By: Patterson Hammersmith, M.D.     ECHO:Study Conclusions  - Left ventricle: The cavity size was normal. There was mild concentric hypertrophy. Systolic function was hyperdynamic. The estimated ejection fraction was in the range of 70% to  75%. Wall motion was normal; there were no regional wall motion abnormalities. - Aortic valve: Mildly calcified annulus. Trileaflet; moderately thickened, moderately calcified leaflets. Valve area: 2.01cm^2(VTI). Valve area: 1.99cm^2 (Vmax). - Mitral valve: Calcified annulus. - Left atrium: The atrium was mildly dilated. - Right ventricle: The cavity size was normal. Wall thickness was mildly increased. - Atrial septum: No defect or patent foramen ovale was identified.  Nuclear Stress test: result pending.     Recent Results (from  the past 240 hour(s))  URINE CULTURE     Status: Normal   Collection Time   06/04/11  5:59 AM      Component Value Range Status Comment   Specimen Description URINE, CLEAN CATCH   Final    Special Requests NONE   Final    Setup Time [id number]   Final    Colony Count NO GROWTH   Final    Culture NO GROWTH   Final    Report Status 06/05/2011 FINAL   Final     BRIEF ADMITTING H & P:Kacy G Wilton is an 77 y.o. female. Diabetes, hypertension, lives alone no history of coronary disease; has been having intermittent chest pain for the past 2 days. She describes the pain as dull and achy no radiation, intensity at about 8/10, lasts for half an hour to an hour and a half, no sweating no dizziness no diaphoresis no nausea or vomiting. Because of its persistent Paps patient presented to the emergency room for evaluation.  Patient has chronic lower extremity edema for at least 2-3 years, he has had no worsening of the edema she has had no leg pain she has had no extended travel.  Denies fever cough or cold; denies dyspnea with exertion.   Physical Exam:  Filed Vitals:    06/03/11 2200  06/04/11 0008  06/04/11 0043  06/04/11 0257   BP:  135/95   144/52  146/46   Pulse:  70  52   47   Resp:   16  14  14    SpO2:  99%  97%  97%  95%    Blood pressure 146/46, pulse 47, resp. rate 14, SpO2 95.00%.  GEN: Pleasant elderly African American lady lying in the  stretcher in no acute distress; cooperative with exam  PSYCH: He is alert and oriented x4; does not appear anxious does not appear depressed; affect is somewhat flat  HEENT: Mucous membranes pink and anicteric; PERRLA; EOM intact; no cervical lymphadenopathy nor thyromegaly or carotid bruit; no JVD;  Breasts:: Not examined  CHEST WALL: No tenderness  CHEST: Normal respiration, clear to auscultation bilaterally  HEART: Bradycardic Regular rhythm; no murmurs rubs or gallops  BACK: No kyphosis or scoliosis; no CVA tenderness  ABDOMEN: Obese, soft non-tender; no masses, no organomegaly, normal abdominal bowel sounds; no pannus; no intertriginous candida.  Rectal Exam: Not done  EXTREMITIES: arthropathy of the hands and knees and especially ankles; 1+ edema bilateral lower extremity to knees, appears chronic; no ulcerations.  Genitalia: not examined  PULSES: 2+ and symmetric  SKIN: Normal hydration no rash or ulceration  CNS: Cranial nerves 2-12 grossly intact no focal neurologic deficit  Labs & Imaging  Results for orders placed during the hospital encounter of 06/03/11 (from the past 48 hour(s))   CBC Status: Abnormal    Collection Time    06/03/11 10:04 PM   Component  Value  Range  Comment    WBC  7.7  4.0 - 10.5 (K/uL)     RBC  3.49 (*)  3.87 - 5.11 (MIL/uL)     Hemoglobin  10.5 (*)  12.0 - 15.0 (g/dL)     HCT  95.6 (*)  21.3 - 46.0 (%)     MCV  86.0  78.0 - 100.0 (fL)     MCH  30.1  26.0 - 34.0 (pg)     MCHC  35.0  30.0 - 36.0 (g/dL)     RDW  08.6  57.8 -  15.5 (%)     Platelets  265  150 - 400 (K/uL)    DIFFERENTIAL Status: Normal    Collection Time    06/03/11 10:04 PM   Component  Value  Range  Comment    Neutrophils Relative  72  43 - 77 (%)     Neutro Abs  5.6  1.7 - 7.7 (K/uL)     Lymphocytes Relative  18  12 - 46 (%)     Lymphs Abs  1.4  0.7 - 4.0 (K/uL)     Monocytes Relative  10  3 - 12 (%)     Monocytes Absolute  0.7  0.1 - 1.0 (K/uL)     Eosinophils Relative  0  0 -  5 (%)     Eosinophils Absolute  0.0  0.0 - 0.7 (K/uL)     Basophils Relative  0  0 - 1 (%)     Basophils Absolute  0.0  0.0 - 0.1 (K/uL)    BASIC METABOLIC PANEL Status: Abnormal    Collection Time    06/03/11 10:04 PM   Component  Value  Range  Comment    Sodium  137  135 - 145 (mEq/L)     Potassium  3.9  3.5 - 5.1 (mEq/L)     Chloride  99  96 - 112 (mEq/L)     CO2  30  19 - 32 (mEq/L)     Glucose, Bld  205 (*)  70 - 99 (mg/dL)     BUN  23  6 - 23 (mg/dL)     Creatinine, Ser  4.54  0.50 - 1.10 (mg/dL)     Calcium  9.3  8.4 - 10.5 (mg/dL)     GFR calc non Af Amer  56 (*)  >90 (mL/min)     GFR calc Af Amer  65 (*)  >90 (mL/min)    CARDIAC PANEL(CRET KIN+CKTOT+MB+TROPI) Status: Abnormal    Collection Time    06/03/11 10:04 PM   Component  Value  Range  Comment    Total CK  207 (*)  7 - 177 (U/L)     CK, MB  2.6  0.3 - 4.0 (ng/mL)     Troponin I  <0.30  <0.30 (ng/mL)     Relative Index  1.3  0.0 - 2.5    POCT I-STAT TROPONIN I Status: Normal    Collection Time    06/03/11 10:16 PM   Component  Value  Range  Comment    Troponin i, poc  0.00  0.00 - 0.08 (ng/mL)     Comment 3         Hospital Course:  Present on Admission:  .Chest pain: She was admitted to telemetry. With no events on telemetry. Cardiac enzymes were cycled x3 which were negative. Initial VQ scan was done as, CT of chest angiogram could not be done dur to her  chronic kidney disease stage III. VQ scan showed low probably for pulmonary embolism. There was a concern that this might be cardiac in nature. Cardiology was consulted a stress test was recommended this was done which is pending at the time of this dictation. I spoke to the cardiologist relates the stress test was negative and from their standpoint there is no further evaluation. Also a 2-D echo was done this showed, and an EF of 75% with some mild calcified aortic valve normal wall motion abnormality.  .Bradycardia: Asymptomatic, no events on telemetry. No  beta  blockers due to bradycardia.   Marland KitchenHTN (hypertension), benign  borderline borderline high. Low pressure To be titrated by her cardiologist as an outpatient. At home she wason tekturna. My guess is this  was probably secondary to her chronic kidney disease. At this time there is no good clinical data to support the use of ACE/ACEI and tekturna. So we have DC'd it Tekturna and kept her on ACE inhibitor receptor blocker.  .DM type 2 (diabetes mellitus, type 2) Good control changes were made hemoglobin A1c is 6.0   .Diabetic neuropathy  no complaints currently stable.  .Edema of both legs  followup with a cardiologist.   Disposition and Follow-up: Discharge Orders    Future Orders Please Complete By Expires   Diet - low sodium heart healthy      Increase activity slowly      Call MD for:      Scheduling Instructions:   Dr. Dietrich Pates develop any further Chets pain.      DISCHARGE EXAM:  In general the patient is alert and oriented x3 in no acute distress HEENT: Moist mucous membranes no JVD no bruits no thyromegaly. Lungs: Good air movement and clear to auscultation Cardiovascular: She has regular rate and rhythm with a normal S1 and S2 no JVD positive pulses in all 4 extremities. Abdomen: Soft, nontender, nondistended. Extremity: Positive pulses and 2+ edema in her lower extremities. Skin: No rashes laceration. Neuro exam: She is alert awake and oriented x3 with coherent and full language. And nonfocal.  Blood pressure 155/75, pulse 56, temperature 98.5 F (36.9 C), temperature source Oral, resp. rate 18, height 5\' 8"  (1.727 m), weight 83.915 kg (185 lb), SpO2 96.00%.   Basename 06/06/11 0446 06/05/11 0549  NA 138 139  K 3.7 3.8  CL 103 102  CO2 28 29  GLUCOSE 102* 104*  BUN 13 12  CREATININE 0.71 0.69  CALCIUM 8.7 9.0  MG -- --  PHOS -- --      Basename 06/05/11 0549  WBC 6.7  NEUTROABS --  HGB 11.1*  HCT 31.5*  MCV 85.8  PLT 253    Signed: FELIZ ORTIZ,  Yazlyn Wentzel 06/07/2011, 9:36 AM

## 2011-06-07 NOTE — Progress Notes (Signed)
Writer discussed discharge instructions and medications to pt, verbalized understanding.  Encouraged to call with any questions that may arise.  Pt took all belongings and wheel chaired out by staff member in stable condition.

## 2012-11-26 ENCOUNTER — Other Ambulatory Visit: Payer: Self-pay | Admitting: *Deleted

## 2012-11-26 ENCOUNTER — Ambulatory Visit
Admission: RE | Admit: 2012-11-26 | Discharge: 2012-11-26 | Disposition: A | Discharge status: Home / Self Care | Payer: No Typology Code available for payment source | Source: Ambulatory Visit | Attending: *Deleted | Admitting: *Deleted

## 2012-11-26 DIAGNOSIS — R05 Cough: Secondary | ICD-10-CM

## 2014-06-26 ENCOUNTER — Encounter (HOSPITAL_COMMUNITY): Payer: Self-pay | Admitting: Emergency Medicine

## 2014-06-26 ENCOUNTER — Emergency Department (HOSPITAL_COMMUNITY)
Admission: EM | Admit: 2014-06-26 | Discharge: 2014-06-27 | Disposition: A | Discharge status: Home / Self Care | Payer: Medicare (Managed Care) | Attending: Emergency Medicine | Admitting: Emergency Medicine

## 2014-06-26 ENCOUNTER — Emergency Department (HOSPITAL_COMMUNITY): Payer: Medicare (Managed Care)

## 2014-06-26 DIAGNOSIS — Z859 Personal history of malignant neoplasm, unspecified: Secondary | ICD-10-CM | POA: Diagnosis not present

## 2014-06-26 DIAGNOSIS — Z79899 Other long term (current) drug therapy: Secondary | ICD-10-CM | POA: Diagnosis not present

## 2014-06-26 DIAGNOSIS — E1165 Type 2 diabetes mellitus with hyperglycemia: Secondary | ICD-10-CM | POA: Insufficient documentation

## 2014-06-26 DIAGNOSIS — G629 Polyneuropathy, unspecified: Secondary | ICD-10-CM | POA: Insufficient documentation

## 2014-06-26 DIAGNOSIS — I10 Essential (primary) hypertension: Secondary | ICD-10-CM | POA: Diagnosis not present

## 2014-06-26 DIAGNOSIS — R739 Hyperglycemia, unspecified: Secondary | ICD-10-CM

## 2014-06-26 DIAGNOSIS — Z7982 Long term (current) use of aspirin: Secondary | ICD-10-CM | POA: Insufficient documentation

## 2014-06-26 DIAGNOSIS — Z880 Allergy status to penicillin: Secondary | ICD-10-CM | POA: Insufficient documentation

## 2014-06-26 DIAGNOSIS — F0390 Unspecified dementia without behavioral disturbance: Secondary | ICD-10-CM | POA: Insufficient documentation

## 2014-06-26 DIAGNOSIS — R55 Syncope and collapse: Secondary | ICD-10-CM | POA: Diagnosis present

## 2014-06-26 LAB — CBC WITH DIFFERENTIAL/PLATELET
BASOS ABS: 0 10*3/uL (ref 0.0–0.1)
Basophils Relative: 0 % (ref 0–1)
EOS PCT: 0 % (ref 0–5)
Eosinophils Absolute: 0 10*3/uL (ref 0.0–0.7)
HCT: 33.4 % — ABNORMAL LOW (ref 36.0–46.0)
Hemoglobin: 11.7 g/dL — ABNORMAL LOW (ref 12.0–15.0)
LYMPHS ABS: 1.9 10*3/uL (ref 0.7–4.0)
LYMPHS PCT: 18 % (ref 12–46)
MCH: 29.1 pg (ref 26.0–34.0)
MCHC: 35 g/dL (ref 30.0–36.0)
MCV: 83.1 fL (ref 78.0–100.0)
Monocytes Absolute: 0.5 10*3/uL (ref 0.1–1.0)
Monocytes Relative: 5 % (ref 3–12)
NEUTROS ABS: 8.2 10*3/uL — AB (ref 1.7–7.7)
NEUTROS PCT: 77 % (ref 43–77)
PLATELETS: 271 10*3/uL (ref 150–400)
RBC: 4.02 MIL/uL (ref 3.87–5.11)
RDW: 13.5 % (ref 11.5–15.5)
WBC: 10.6 10*3/uL — AB (ref 4.0–10.5)

## 2014-06-26 LAB — URINALYSIS, ROUTINE W REFLEX MICROSCOPIC
Bilirubin Urine: NEGATIVE
Glucose, UA: NEGATIVE mg/dL
HGB URINE DIPSTICK: NEGATIVE
Ketones, ur: NEGATIVE mg/dL
LEUKOCYTES UA: NEGATIVE
Nitrite: NEGATIVE
Protein, ur: NEGATIVE mg/dL
SPECIFIC GRAVITY, URINE: 1.024 (ref 1.005–1.030)
UROBILINOGEN UA: 0.2 mg/dL (ref 0.0–1.0)
pH: 6.5 (ref 5.0–8.0)

## 2014-06-26 LAB — BASIC METABOLIC PANEL
Anion gap: 11 (ref 5–15)
BUN: 14 mg/dL (ref 6–23)
CHLORIDE: 94 meq/L — AB (ref 96–112)
CO2: 31 mEq/L (ref 19–32)
Calcium: 9.5 mg/dL (ref 8.4–10.5)
Creatinine, Ser: 0.75 mg/dL (ref 0.50–1.10)
GFR calc non Af Amer: 78 mL/min — ABNORMAL LOW (ref >90)
GLUCOSE: 248 mg/dL — AB (ref 70–99)
POTASSIUM: 4.5 meq/L (ref 3.7–5.3)
SODIUM: 136 meq/L — AB (ref 137–147)

## 2014-06-26 NOTE — Discharge Instructions (Signed)
Hyperglycemia °Hyperglycemia occurs when the glucose (sugar) in your blood is too high. Hyperglycemia can happen for many reasons, but it most often happens to people who do not know they have diabetes or are not managing their diabetes properly.  °CAUSES  °Whether you have diabetes or not, there are other causes of hyperglycemia. Hyperglycemia can occur when you have diabetes, but it can also occur in other situations that you might not be as aware of, such as: °Diabetes °· If you have diabetes and are having problems controlling your blood glucose, hyperglycemia could occur because of some of the following reasons: °· Not following your meal plan. °· Not taking your diabetes medications or not taking it properly. °· Exercising less or doing less activity than you normally do. °· Being sick. °Pre-diabetes °· This cannot be ignored. Before people develop Type 2 diabetes, they almost always have "pre-diabetes." This is when your blood glucose levels are higher than normal, but not yet high enough to be diagnosed as diabetes. Research has shown that some long-term damage to the body, especially the heart and circulatory system, may already be occurring during pre-diabetes. If you take action to manage your blood glucose when you have pre-diabetes, you may delay or prevent Type 2 diabetes from developing. °Stress °· If you have diabetes, you may be "diet" controlled or on oral medications or insulin to control your diabetes. However, you may find that your blood glucose is higher than usual in the hospital whether you have diabetes or not. This is often referred to as "stress hyperglycemia." Stress can elevate your blood glucose. This happens because of hormones put out by the body during times of stress. If stress has been the cause of your high blood glucose, it can be followed regularly by your caregiver. That way he/she can make sure your hyperglycemia does not continue to get worse or progress to  diabetes. °Steroids °· Steroids are medications that act on the infection fighting system (immune system) to block inflammation or infection. One side effect can be a rise in blood glucose. Most people can produce enough extra insulin to allow for this rise, but for those who cannot, steroids make blood glucose levels go even higher. It is not unusual for steroid treatments to "uncover" diabetes that is developing. It is not always possible to determine if the hyperglycemia will go away after the steroids are stopped. A special blood test called an A1c is sometimes done to determine if your blood glucose was elevated before the steroids were started. °SYMPTOMS °· Thirsty. °· Frequent urination. °· Dry mouth. °· Blurred vision. °· Tired or fatigue. °· Weakness. °· Sleepy. °· Tingling in feet or leg. °DIAGNOSIS  °Diagnosis is made by monitoring blood glucose in one or all of the following ways: °· A1c test. This is a chemical found in your blood. °· Fingerstick blood glucose monitoring. °· Laboratory results. °TREATMENT  °First, knowing the cause of the hyperglycemia is important before the hyperglycemia can be treated. Treatment may include, but is not be limited to: °· Education. °· Change or adjustment in medications. °· Change or adjustment in meal plan. °· Treatment for an illness, infection, etc. °· More frequent blood glucose monitoring. °· Change in exercise plan. °· Decreasing or stopping steroids. °· Lifestyle changes. °HOME CARE INSTRUCTIONS  °· Test your blood glucose as directed. °· Exercise regularly. Your caregiver will give you instructions about exercise. Pre-diabetes or diabetes which comes on with stress is helped by exercising. °· Eat wholesome,   balanced meals. Eat often and at regular, fixed times. Your caregiver or nutritionist will give you a meal plan to guide your sugar intake. °· Being at an ideal weight is important. If needed, losing as little as 10 to 15 pounds may help improve blood  glucose levels. °SEEK MEDICAL CARE IF:  °· You have questions about medicine, activity, or diet. °· You continue to have symptoms (problems such as increased thirst, urination, or weight gain). °SEEK IMMEDIATE MEDICAL CARE IF:  °· You are vomiting or have diarrhea. °· Your breath smells fruity. °· You are breathing faster or slower. °· You are very sleepy or incoherent. °· You have numbness, tingling, or pain in your feet or hands. °· You have chest pain. °· Your symptoms get worse even though you have been following your caregiver's orders. °· If you have any other questions or concerns. °Document Released: 01/22/2001 Document Revised: 10/21/2011 Document Reviewed: 11/25/2011 °[employer]® Patient Information ©2015 [employer]. This information is not intended to replace advice given to you by your health care provider. Make sure you discuss any questions you have with your health care provider. ° °Near-Syncope °Near-syncope (commonly known as near fainting) is sudden weakness, dizziness, or feeling like you might pass out. During an episode of near-syncope, you may also develop pale skin, have tunnel vision, or feel sick to your stomach (nauseous). Near-syncope may occur when getting up after sitting or while standing for a long time. It is caused by a sudden decrease in blood flow to the brain. This decrease can result from various causes or triggers, most of which are not serious. However, because near-syncope can sometimes be a sign of something serious, a medical evaluation is required. The specific cause is often not determined. °HOME CARE INSTRUCTIONS  °Monitor your condition for any changes. The following actions may help to alleviate any discomfort you are experiencing: °· Have someone stay with you until you feel stable. °· Lie down right away and prop your feet up if you start feeling like you might faint. Breathe deeply and steadily. Wait until all the symptoms have passed. Most of these episodes last  only a few minutes. You may feel tired for several hours.   °· Drink enough fluids to keep your urine clear or pale yellow.   °· If you are taking blood pressure or heart medicine, get up slowly when seated or lying down. Take several minutes to sit and then stand. This can reduce dizziness. °· Follow up with your health care provider as directed.  °SEEK IMMEDIATE MEDICAL CARE IF:  °· You have a severe headache.   °· You have unusual pain in the chest, abdomen, or back.   °· You are bleeding from the mouth or rectum, or you have black or tarry stool.   °· You have an irregular or very fast heartbeat.   °· You have repeated fainting or have seizure-like jerking during an episode.   °· You faint when sitting or lying down.   °· You have confusion.   °· You have difficulty walking.   °· You have severe weakness.   °· You have vision problems.   °MAKE SURE YOU:  °· Understand these instructions. °· Will watch your condition. °· Will get help right away if you are not doing well or get worse. °Document Released: 07/29/2005 Document Revised: 08/03/2013 Document Reviewed: 01/01/2013 °Exit[employer]® Patient Information ©2015 [employer]. This information is not intended to replace advice given to you by your health care provider. Make sure you discuss any questions you have with your health care   health care provider. ° °

## 2014-06-26 NOTE — ED Notes (Signed)
Awake. Verbally responsive. A/O x1 to self. Resp even and unlabored. ABC's  Intact. IV saline lock to rt hand. SR on monitor at 68bpm. NAD noted. Pleasant and cooperative mood.

## 2014-06-26 NOTE — ED Provider Notes (Signed)
CSN: [id number]     Arrival date & time 06/26/14  1828 History   First MD Initiated Contact with Patient 06/26/14 1848     Chief Complaint  Patient presents with  . Near Syncope     (Consider location/radiation/quality/duration/timing/severity/associated sxs/prior Treatment) HPI  80 year old female brought in by EMS from a nursing facility for concern for possible syncope. The patient is alert and oriented to herself and place but does not know why she was brought to the hospital. She states she feels fine. No pain. No chest pain or headaches. EMS reports to the nurse that the patient was difficult to arouse while in a chair and they were not sure if she had passed out. When EMS arrived patient was alert and at her normal baseline. At this time there is no paperwork at the bedside to say where the patient is from and I'm unable to get into contact with any facility that takes care of her to get further history.  Past Medical History  Diagnosis Date  . Diabetes mellitus   . Hypertension   . Hypercholesteremia   . Neuropathy   . Cancer    Past Surgical History  Procedure Laterality Date  . Abdominal hysterectomy    . Appendectomy     Family History  Problem Relation Age of Onset  . Stroke Sister   . Heart failure Sister   . Diabetes Sister   . Cancer Brother    History  Substance Use Topics  . Smoking status: Never Smoker   . Smokeless tobacco: Never Used  . Alcohol Use: No   OB History    No data available     Review of Systems  Unable to perform ROS: Dementia      Allergies  Codeine; Penicillins; Penicillins; and Pregabalin  Home Medications   Prior to Admission medications   Medication Sig Start Date End Date Taking? Authorizing Provider  amLODipine (NORVASC) 5 MG tablet Take 1 tablet (5 mg total) by mouth daily. 06/07/11 06/06/12  Charlynne Cousins, MD  aspirin 325 MG tablet Take 325 mg by mouth daily.      Historical Provider, MD  donepezil (ARICEPT) 10  MG tablet Take 10 mg by mouth at bedtime as needed.      Historical Provider, MD  furosemide (LASIX) 20 MG tablet Take 20 mg by mouth as directed. Take one tablet on Mondays, Wednesdays, Fridays, Saturdays, and Sundays. Take two tablets on Tuesdays and Thursdays     Historical Provider, MD  gabapentin (NEURONTIN) 300 MG capsule Take 300 mg by mouth every evening. For left foot pain     Historical Provider, MD  glipiZIDE (GLUCOTROL XL) 10 MG 24 hr tablet Take 10 mg by mouth daily.      Historical Provider, MD  memantine (NAMENDA) 10 MG tablet Take 10 mg by mouth 2 (two) times daily.      Historical Provider, MD  Multiple Vitamins-Minerals (CENTRUM SILVER ULTRA WOMENS PO) Take 1 tablet by mouth daily.      Historical Provider, MD  pioglitazone (ACTOS) 15 MG tablet Take 15 mg by mouth daily.      Historical Provider, MD  polyethylene glycol powder (GLYCOLAX/MIRALAX) powder Take 17 g by mouth daily as needed. For constipation     Historical Provider, MD  potassium chloride SA (K-DUR,KLOR-CON) 20 MEQ tablet Take 20 mEq by mouth as directed. Take one tablet by mouth on Mondays, Wednesdays, Fridays, Saturdays, and Sundays, and take two tablets on Tuesdays and Thursdays  Historical Provider, MD  valsartan (DIOVAN) 160 MG tablet Take 1 tablet (160 mg total) by mouth daily. 06/07/11 06/06/12  Charlynne Cousins, MD   BP 120/49 mmHg  Pulse 61  Temp(Src) 97.5 F (36.4 C) (Oral)  Resp 18  SpO2 97% Physical Exam  Constitutional: She appears well-developed and well-nourished. No distress.  HENT:  Head: Normocephalic and atraumatic.  Right Ear: External ear normal.  Left Ear: External ear normal.  Nose: Nose normal.  Eyes: EOM are normal. Pupils are equal, round, and reactive to light. Right eye exhibits no discharge. Left eye exhibits no discharge.  Cardiovascular: Normal rate, regular rhythm and normal heart sounds.   Pulmonary/Chest: Effort normal and breath sounds normal.  Abdominal: Soft. She  exhibits no distension. There is no tenderness.  Neurological: She is alert. She is disoriented.  CN 2-12 grossly intact. 5/5 strength in all 4 extremities. Oriented to self, place. Disoriented to time/date  Skin: Skin is warm and dry. She is not diaphoretic.  Nursing note and vitals reviewed.   ED Course  Procedures (including critical care time) Labs Review Labs Reviewed  BASIC METABOLIC PANEL - Abnormal; Notable for the following:    Sodium 136 (*)    Chloride 94 (*)    Glucose, Bld 248 (*)    GFR calc non Af Amer 78 (*)    All other components within normal limits  CBC WITH DIFFERENTIAL - Abnormal; Notable for the following:    WBC 10.6 (*)    Hemoglobin 11.7 (*)    HCT 33.4 (*)    Neutro Abs 8.2 (*)    All other components within normal limits  URINALYSIS, ROUTINE W REFLEX MICROSCOPIC  CBC WITH DIFFERENTIAL    Imaging Review Ct Head Wo Contrast  06/26/2014   CLINICAL DATA:  Altered mental status.  Somnolence.  Syncope.  EXAM: CT HEAD WITHOUT CONTRAST  TECHNIQUE: Contiguous axial images were obtained from the base of the skull through the vertex without intravenous contrast.  COMPARISON:  09/22/2009.  FINDINGS: No mass lesion, mass effect, midline shift, hydrocephalus, hemorrhage. No acute territorial cortical ischemia/infarct. Atrophy and chronic ischemic white matter disease is present. Benign basal ganglia calcifications.  Calvarium intact. Paranasal sinuses appear within normal limits aside from a small mucous retention cyst/ polyp in the anterior LEFT sphenoid sinus. The mastoid air cells appear normal.  IMPRESSION: Atrophy and chronic ischemic white matter disease without acute intracranial abnormality.   Electronically Signed   By: Dereck Ligas M.D.   On: 06/26/2014 19:54    Date: 06/26/2014  Rate: 62  Rhythm: normal sinus rhythm  QRS Axis: normal  Intervals: normal  ST/T Wave abnormalities: normal  Conduction Disutrbances:none  Narrative Interpretation:   Old  EKG Reviewed: unchanged    MDM   Final diagnoses:  Hyperglycemia    Unclear etiology of why the patient was difficult to arouse at the facility. There is no evidence that she had a seizure. Her work appears unremarkable besides mild hyperglycemia. At this point she is at her normal mental baseline. Feels more likely that she was just asleep rather than having a syncopal episode. Will discharge back to her nursing facility.    Ephraim Hamburger, MD 06/26/14 [phone]

## 2014-06-26 NOTE — ED Notes (Signed)
Resting quietly with eyes closed. Easily arousable. Verbally responsive. Resp even and unlabored. ABC's intact. SR on monitor at 64bpm. NAD noted.

## 2014-06-26 NOTE — ED Notes (Signed)
Called and left message x2 at [facility] to get more information on pt concerning condition.

## 2014-06-26 NOTE — ED Notes (Signed)
Per EMS: pt from memory care unit, staff states pt was hard to wake in chair, no sure if syncopal. Upon fire arrival pt awoke, with EMS pt has been alert to normal baseline. Pt has no complaints, no pain.

## 2014-06-26 NOTE — ED Notes (Signed)
Bed: [facility] Expected date:  Expected time:  Means of arrival:  Comments: ? Near syncopal episode

## 2014-06-26 NOTE — ED Notes (Signed)
Resting quietly with eyes closed. Easily arousable. Verbally responsive. Resp even and unlabored. ABC's intact. IV SL patent and intact. NAD noted. Spoken with facility representative and reported that pt was sitting in chair and assisted to floor d/t to syncope episode lasting a few seconds.

## 2014-06-26 NOTE — ED Notes (Signed)
Pt ambulated to BR with steady gait. Pt attempted to give urine sample and void in commode instead of urinary hat collection.

## 2014-06-27 NOTE — ED Notes (Signed)
Resting quietly with eyes closed. Easily arousable. Verbally responsive. Resp even and unlabored. ABC's intact. NAD noted.

## 2014-06-27 NOTE — ED Notes (Signed)
EMS arrived to transport pt to [facility].

## 2014-06-27 NOTE — ED Notes (Signed)
Resting quietly with eyes closed. Easily arousable. Verbally responsive. Resp even and unlabored. ABC's intact.  

## 2014-06-27 NOTE — ED Notes (Signed)
Awake. Verbally responsive. Pt made aware of transporting back to [facility] via ambulance. ABC's intact. Resp even and unlabored.

## 2014-06-27 NOTE — ED Notes (Signed)
Attempted to call and give report to [facility].

## 2014-06-27 NOTE — ED Notes (Signed)
Called and requested ambulance transport back to facility.

## 2015-01-07 ENCOUNTER — Emergency Department (HOSPITAL_COMMUNITY): Payer: Medicare (Managed Care)

## 2015-01-07 ENCOUNTER — Emergency Department (HOSPITAL_COMMUNITY)
Admission: EM | Admit: 2015-01-07 | Discharge: 2015-01-07 | Disposition: A | Discharge status: Home / Self Care | Payer: Medicare (Managed Care) | Attending: Emergency Medicine | Admitting: Emergency Medicine

## 2015-01-07 ENCOUNTER — Encounter (HOSPITAL_COMMUNITY): Payer: Self-pay | Admitting: *Deleted

## 2015-01-07 DIAGNOSIS — Y998 Other external cause status: Secondary | ICD-10-CM | POA: Insufficient documentation

## 2015-01-07 DIAGNOSIS — S0083XA Contusion of other part of head, initial encounter: Secondary | ICD-10-CM

## 2015-01-07 DIAGNOSIS — E119 Type 2 diabetes mellitus without complications: Secondary | ICD-10-CM | POA: Diagnosis not present

## 2015-01-07 DIAGNOSIS — M25559 Pain in unspecified hip: Secondary | ICD-10-CM

## 2015-01-07 DIAGNOSIS — Z880 Allergy status to penicillin: Secondary | ICD-10-CM | POA: Insufficient documentation

## 2015-01-07 DIAGNOSIS — Z859 Personal history of malignant neoplasm, unspecified: Secondary | ICD-10-CM | POA: Diagnosis not present

## 2015-01-07 DIAGNOSIS — S79911A Unspecified injury of right hip, initial encounter: Secondary | ICD-10-CM | POA: Diagnosis not present

## 2015-01-07 DIAGNOSIS — Z7982 Long term (current) use of aspirin: Secondary | ICD-10-CM | POA: Insufficient documentation

## 2015-01-07 DIAGNOSIS — S3992XA Unspecified injury of lower back, initial encounter: Secondary | ICD-10-CM | POA: Insufficient documentation

## 2015-01-07 DIAGNOSIS — Y92128 Other place in nursing home as the place of occurrence of the external cause: Secondary | ICD-10-CM | POA: Diagnosis not present

## 2015-01-07 DIAGNOSIS — Y9389 Activity, other specified: Secondary | ICD-10-CM | POA: Diagnosis not present

## 2015-01-07 DIAGNOSIS — R011 Cardiac murmur, unspecified: Secondary | ICD-10-CM | POA: Insufficient documentation

## 2015-01-07 DIAGNOSIS — I10 Essential (primary) hypertension: Secondary | ICD-10-CM | POA: Insufficient documentation

## 2015-01-07 DIAGNOSIS — W19XXXA Unspecified fall, initial encounter: Secondary | ICD-10-CM | POA: Diagnosis not present

## 2015-01-07 DIAGNOSIS — F0390 Unspecified dementia without behavioral disturbance: Secondary | ICD-10-CM | POA: Insufficient documentation

## 2015-01-07 DIAGNOSIS — G629 Polyneuropathy, unspecified: Secondary | ICD-10-CM | POA: Insufficient documentation

## 2015-01-07 DIAGNOSIS — Z79899 Other long term (current) drug therapy: Secondary | ICD-10-CM | POA: Insufficient documentation

## 2015-01-07 LAB — CBC WITH DIFFERENTIAL/PLATELET
Basophils Absolute: 0 10*3/uL (ref 0.0–0.1)
Basophils Relative: 0 % (ref 0–1)
EOS ABS: 0 10*3/uL (ref 0.0–0.7)
Eosinophils Relative: 0 % (ref 0–5)
HEMATOCRIT: 35.6 % — AB (ref 36.0–46.0)
Hemoglobin: 12.2 g/dL (ref 12.0–15.0)
LYMPHS ABS: 1.5 10*3/uL (ref 0.7–4.0)
Lymphocytes Relative: 15 % (ref 12–46)
MCH: 29.1 pg (ref 26.0–34.0)
MCHC: 34.3 g/dL (ref 30.0–36.0)
MCV: 85 fL (ref 78.0–100.0)
MONO ABS: 0.4 10*3/uL (ref 0.1–1.0)
MONOS PCT: 4 % (ref 3–12)
NEUTROS ABS: 8.1 10*3/uL — AB (ref 1.7–7.7)
Neutrophils Relative %: 81 % — ABNORMAL HIGH (ref 43–77)
Platelets: 268 10*3/uL (ref 150–400)
RBC: 4.19 MIL/uL (ref 3.87–5.11)
RDW: 13.5 % (ref 11.5–15.5)
WBC: 10 10*3/uL (ref 4.0–10.5)

## 2015-01-07 LAB — URINALYSIS, ROUTINE W REFLEX MICROSCOPIC
BILIRUBIN URINE: NEGATIVE
GLUCOSE, UA: NEGATIVE mg/dL
HGB URINE DIPSTICK: NEGATIVE
KETONES UR: NEGATIVE mg/dL
LEUKOCYTES UA: NEGATIVE
NITRITE: NEGATIVE
PROTEIN: NEGATIVE mg/dL
SPECIFIC GRAVITY, URINE: 1.008 (ref 1.005–1.030)
UROBILINOGEN UA: 0.2 mg/dL (ref 0.0–1.0)
pH: 7 (ref 5.0–8.0)

## 2015-01-07 LAB — COMPREHENSIVE METABOLIC PANEL
ALK PHOS: 107 U/L (ref 38–126)
ALT: 18 U/L (ref 14–54)
AST: 25 U/L (ref 15–41)
Albumin: 3.9 g/dL (ref 3.5–5.0)
Anion gap: 9 (ref 5–15)
BUN: 14 mg/dL (ref 6–20)
CALCIUM: 9.5 mg/dL (ref 8.9–10.3)
CO2: 30 mmol/L (ref 22–32)
Chloride: 102 mmol/L (ref 101–111)
Creatinine, Ser: 0.52 mg/dL (ref 0.44–1.00)
Glucose, Bld: 120 mg/dL — ABNORMAL HIGH (ref 65–99)
Potassium: 4.5 mmol/L (ref 3.5–5.1)
Sodium: 141 mmol/L (ref 135–145)
TOTAL PROTEIN: 7.7 g/dL (ref 6.5–8.1)
Total Bilirubin: 0.6 mg/dL (ref 0.3–1.2)

## 2015-01-07 NOTE — ED Provider Notes (Signed)
CSN: [id number]     Arrival date & time 01/07/15  1228 History   First MD Initiated Contact with Patient 01/07/15 1239     Chief Complaint  Patient presents with  . Fall     (Consider location/radiation/quality/duration/timing/severity/associated sxs/prior Treatment) HPI Caitlin Kline is a 81 y.o. female with history of dementia, diabetes, hypertension, neuropathy, presents to emergency department after a fall. This was unwitnessed fall at her nursing home. Patient apparently was last seen in a wheelchair, when staff found her a few minutes later sitting by the wall on the floor. Patient was found to have a small contusion to the left face. She is complaining of right hip pain. Patient is pleasantly demented, unable provide any more history.  Past Medical History  Diagnosis Date  . Diabetes mellitus   . Hypertension   . Hypercholesteremia   . Neuropathy   . Cancer    Past Surgical History  Procedure Laterality Date  . Abdominal hysterectomy    . Appendectomy     Family History  Problem Relation Age of Onset  . Stroke Sister   . Heart failure Sister   . Diabetes Sister   . Cancer Brother    History  Substance Use Topics  . Smoking status: Never Smoker   . Smokeless tobacco: Never Used  . Alcohol Use: No   OB History    No data available     Review of Systems  Unable to perform ROS: Dementia      Allergies  Codeine; Penicillins; Penicillins; and Pregabalin  Home Medications   Prior to Admission medications   Medication Sig Start Date End Date Taking? Authorizing Provider  acetaminophen (TYLENOL) 325 MG tablet Take 650 mg by mouth every 6 (six) hours as needed for mild pain or moderate pain.   Yes Historical Provider, MD  acetaminophen (TYLENOL) 650 MG CR tablet Take 650 mg by mouth 2 (two) times daily.   Yes Historical Provider, MD  amLODipine (NORVASC) 5 MG tablet Take 5 mg by mouth daily.   Yes Historical Provider, MD  aspirin EC 81 MG tablet Take 81 mg by  mouth daily.   Yes Historical Provider, MD  donepezil (ARICEPT) 10 MG tablet Take 10 mg by mouth daily.    Yes Historical Provider, MD  furosemide (LASIX) 20 MG tablet Take 20 mg by mouth daily. May hold on Sundays for [facility]   Yes Historical Provider, MD  losartan (COZAAR) 100 MG tablet Take 100 mg by mouth daily.   Yes Historical Provider, MD  memantine (NAMENDA) 10 MG tablet Take 10 mg by mouth 2 (two) times daily.     Yes Historical Provider, MD  metFORMIN (GLUCOPHAGE) 1000 MG tablet Take 1,000 mg by mouth at bedtime. With food   Yes Historical Provider, MD  Multiple Vitamins-Minerals (CERTA-VITE PO) Take 1 tablet by mouth daily.   Yes Historical Provider, MD  potassium chloride SA (K-DUR,KLOR-CON) 20 MEQ tablet Take 20 mEq by mouth daily.    Yes Historical Provider, MD  QUEtiapine (SEROQUEL) 25 MG tablet Take 25 mg by mouth at bedtime.    Yes Historical Provider, MD  amLODipine (NORVASC) 5 MG tablet Take 1 tablet (5 mg total) by mouth daily. 06/07/11 06/26/14  Charlynne Cousins, MD  glipiZIDE (GLUCOTROL XL) 10 MG 24 hr tablet Take 10 mg by mouth daily.      Historical Provider, MD  valsartan (DIOVAN) 160 MG tablet Take 1 tablet (160 mg total) by mouth daily. 06/07/11 06/06/12  Charlynne Cousins, MD   BP 147/64 mmHg  Pulse 71  Temp(Src) 98.6 F (37 C) (Oral)  SpO2 99% Physical Exam  Constitutional: She appears well-developed and well-nourished. No distress.  HENT:  Head: Normocephalic.  Hematoma to the left maxilla  Eyes: Conjunctivae and EOM are normal. Pupils are equal, round, and reactive to light.  Neck: Normal range of motion. Neck supple.  Cardiovascular: Normal rate and regular rhythm.   Murmur heard. Pulmonary/Chest: Effort normal and breath sounds normal. No respiratory distress. She has no wheezes. She has no rales.  Abdominal: Soft. Bowel sounds are normal. She exhibits no distension. There is no tenderness. There is no rebound.  Musculoskeletal: She exhibits no edema.   Swelling to bilateral lower extremities mentioned down. Nonpitting. No midline tenderness over the cervical or thoracic spine. Tender to palpation over midline lumbar spine. Tender to palpation over lateral hip. Full range of motion of the right hip, no pain with flexion, extension, internal or external rotation. Normal bilateral knees and ankles. Full range of motion of upper extremities at all joints.  Neurological: She is alert.  Oriented to self only. Moving all extremities without any difficulty, following all directions. 5/5 and equal upper and lower extremity strength bilaterally. Equal grip strength bilaterally. Normal finger to nose and heel to shin. No pronator drift. Patellar reflexes 2+   Skin: Skin is warm and dry.  Psychiatric: She has a normal mood and affect. Her behavior is normal.  Nursing note and vitals reviewed.   ED Course  Procedures (including critical care time) Labs Review Labs Reviewed  CBC WITH DIFFERENTIAL/PLATELET - Abnormal; Notable for the following:    HCT 35.6 (*)    Neutrophils Relative % 81 (*)    Neutro Abs 8.1 (*)    All other components within normal limits  COMPREHENSIVE METABOLIC PANEL - Abnormal; Notable for the following:    Glucose, Bld 120 (*)    All other components within normal limits  URINALYSIS, ROUTINE W REFLEX MICROSCOPIC (NOT AT [facility])    Imaging Review Dg Lumbar Spine Complete  01/07/2015   CLINICAL DATA:  Fall.  Pain in right hip.  EXAM: LUMBAR SPINE - COMPLETE 4+ VIEW  COMPARISON:  04/09/2010  FINDINGS: Normal alignment of the lumbar spine. There is multi level disc space narrowing and exuberant ventral endplate spurring identified. This is most advanced at the L4-5 level. No fractures identified.  IMPRESSION: 1. Lumbar degenerative disc disease. 2. No acute findings.   Electronically Signed   By: Kerby Moors M.D.   On: 01/07/2015 13:41   Ct Head Wo Contrast  01/07/2015   CLINICAL DATA:  Unwitnessed fall - in chair and then  against the wall. Patient does not remember what occurredSmall hematoma above left eyeNo neck pain, no back painAmbulates w/assistance  EXAM: CT HEAD WITHOUT CONTRAST  CT CERVICAL SPINE WITHOUT CONTRAST  TECHNIQUE: Multidetector CT imaging of the head and cervical spine was performed following the standard protocol without intravenous contrast. Multiplanar CT image reconstructions of the cervical spine were also generated.  COMPARISON:  06/26/2014  FINDINGS: CT HEAD FINDINGS  Ventricles are normal configuration. There is ventricular and sulcal enlargement moderate atrophy. No hydrocephalus.  There are no parenchymal masses or mass effect. There is no evidence of a cortical infarct. Patchy white matter hypoattenuation is noted consistent with moderate chronic microvascular ischemic change.  There are no extra-axial masses or abnormal fluid collections.  There is no intracranial hemorrhage.  There is left supraorbital soft tissue  swelling/hematoma. No underlying skull fracture.  Visualized sinuses and mastoid air cells are essentially clear.  CT CERVICAL SPINE FINDINGS  No fracture. No spondylolisthesis. Status post anterior cervical spine fusion from C3 through C5. Bone graft material and fusion hardware is well-seated with no evidence of loosening. There is also fusion across most of the disc space at C6-C7. There are degenerative changes anterior endplate osteophytes at C2-C3 and C5-C6. Central stenosis is 7 most prominently at the C4 level with the AP diameter of the canal measuring approximately 7 mm.  Soft tissues show carotid vascular calcifications and mild heterogeneity of the thyroid gland. Lung apices are clear.  IMPRESSION: HEAD CT: No acute intracranial abnormality. Left supraorbital soft tissue contusion/ hematoma.  CERVICAL CT: No fracture or acute finding. Disruption of the fusion hardware.   Electronically Signed   By: Lajean Manes M.D.   On: 01/07/2015 13:39   Ct Cervical Spine Wo  Contrast  01/07/2015   CLINICAL DATA:  Unwitnessed fall - in chair and then against the wall. Patient does not remember what occurredSmall hematoma above left eyeNo neck pain, no back painAmbulates w/assistance  EXAM: CT HEAD WITHOUT CONTRAST  CT CERVICAL SPINE WITHOUT CONTRAST  TECHNIQUE: Multidetector CT imaging of the head and cervical spine was performed following the standard protocol without intravenous contrast. Multiplanar CT image reconstructions of the cervical spine were also generated.  COMPARISON:  06/26/2014  FINDINGS: CT HEAD FINDINGS  Ventricles are normal configuration. There is ventricular and sulcal enlargement moderate atrophy. No hydrocephalus.  There are no parenchymal masses or mass effect. There is no evidence of a cortical infarct. Patchy white matter hypoattenuation is noted consistent with moderate chronic microvascular ischemic change.  There are no extra-axial masses or abnormal fluid collections.  There is no intracranial hemorrhage.  There is left supraorbital soft tissue swelling/hematoma. No underlying skull fracture.  Visualized sinuses and mastoid air cells are essentially clear.  CT CERVICAL SPINE FINDINGS  No fracture. No spondylolisthesis. Status post anterior cervical spine fusion from C3 through C5. Bone graft material and fusion hardware is well-seated with no evidence of loosening. There is also fusion across most of the disc space at C6-C7. There are degenerative changes anterior endplate osteophytes at C2-C3 and C5-C6. Central stenosis is 7 most prominently at the C4 level with the AP diameter of the canal measuring approximately 7 mm.  Soft tissues show carotid vascular calcifications and mild heterogeneity of the thyroid gland. Lung apices are clear.  IMPRESSION: HEAD CT: No acute intracranial abnormality. Left supraorbital soft tissue contusion/ hematoma.  CERVICAL CT: No fracture or acute finding. Disruption of the fusion hardware.   Electronically Signed   By: Lajean Manes M.D.   On: 01/07/2015 13:39   Dg Hip Unilat With Pelvis 2-3 Views Right  01/07/2015   CLINICAL DATA:  Fall, right hip pain  EXAM: RIGHT HIP (WITH PELVIS) 2-3 VIEWS  COMPARISON:  None.  FINDINGS: There is no evidence of hip fracture or dislocation. There is no evidence of arthropathy or other focal bone abnormality. Lumbar spine disc degenerative change.  IMPRESSION: Negative.   Electronically Signed   By: Conchita Paris M.D.   On: 01/07/2015 13:42     EKG Interpretation   Date/Time:  Saturday Jan 07 2015 13:07:51 EDT Ventricular Rate:  67 PR Interval:  159 QRS Duration: 86 QT Interval:  394 QTC Calculation: 416 R Axis:   14 Text Interpretation:  Pacemaker spikes or artifacts Sinus rhythm Low  voltage, precordial leads  no significant change since 2007 Confirmed by  GOLDSTON  MD, SCOTT [phone]) on 01/07/2015 1:37:54 PM      MDM   Final diagnoses:  Hip pain  Facial contusion, initial encounter  Fall, initial encounter   Patient emergency department after unwitnessed fall. She is unable to provide any history, in fact states she did not fall. She states she's having right hip pain but does not know why. Will get labs, basic, she is denying any dizziness or chest pain. Sounds like she has fallen by trying to stand up from a wheelchair. Patient is alert, oriented to self only. Will get CT of the head and cervical spine, lumbar film, right hip x-ray. We'll get urinalysis.   3:12 PM X-rays and CTs are all with no acute abnormal findings. The patient ambulated up and down the hallway with assistance. Will discharge home with close outpatient follow-up. She does not appear to be in pain. She has no complaints. VS normal.   Filed Vitals:   01/07/15 1241 01/07/15 1504  BP: 147/64 135/81  Pulse: 71 73  Temp: 98.6 F (37 C) 97.6 F (36.4 C)  TempSrc: Oral Oral  Resp:  20  SpO2: 99% 100%     Jeannett Senior, PA-C 01/07/15 [place], MD 01/08/15 [phone]

## 2015-01-07 NOTE — ED Notes (Signed)
Patient transported to CT 

## 2015-01-07 NOTE — ED Notes (Signed)
Pt was able to ambulate with assistance x2. PA aware.

## 2015-01-07 NOTE — ED Notes (Signed)
Caitlin Kline; sister Home [phone]. Cell [phone].

## 2015-01-07 NOTE — ED Notes (Signed)
Bed: HS30 Expected date: 01/07/15 Expected time: 12:18 PM Means of arrival: Ambulance Comments: fall

## 2015-01-07 NOTE — ED Notes (Signed)
Unwitnessed fall - in chair and then against the wall.  Patient does not remember what occurred Small hematoma above left eye No neck pain, no back pain Ambulates w/assistance  Upper right hip pain - no deformities or other obvious injuries 150/88 76 16 97%room air cbg 152 NSR

## 2015-01-07 NOTE — Discharge Instructions (Signed)
CT head and cervical spine, xrays of lumbar spine, hip, all negative. Follow up with primary care doctor as needed.

## 2015-01-09 LAB — CBG MONITORING, ED: Glucose-Capillary: 120 mg/dL — ABNORMAL HIGH (ref 65–99)

## 2015-02-13 ENCOUNTER — Encounter (HOSPITAL_COMMUNITY): Payer: Self-pay | Admitting: Emergency Medicine

## 2015-02-13 ENCOUNTER — Emergency Department (HOSPITAL_COMMUNITY): Payer: Medicare (Managed Care)

## 2015-02-13 ENCOUNTER — Emergency Department (HOSPITAL_COMMUNITY)
Admission: EM | Admit: 2015-02-13 | Discharge: 2015-02-13 | Disposition: A | Discharge status: Home / Self Care | Payer: Medicare (Managed Care) | Attending: Emergency Medicine | Admitting: Emergency Medicine

## 2015-02-13 DIAGNOSIS — W19XXXA Unspecified fall, initial encounter: Secondary | ICD-10-CM

## 2015-02-13 DIAGNOSIS — Z79899 Other long term (current) drug therapy: Secondary | ICD-10-CM | POA: Insufficient documentation

## 2015-02-13 DIAGNOSIS — Z880 Allergy status to penicillin: Secondary | ICD-10-CM | POA: Insufficient documentation

## 2015-02-13 DIAGNOSIS — Y92128 Other place in nursing home as the place of occurrence of the external cause: Secondary | ICD-10-CM | POA: Insufficient documentation

## 2015-02-13 DIAGNOSIS — E119 Type 2 diabetes mellitus without complications: Secondary | ICD-10-CM | POA: Insufficient documentation

## 2015-02-13 DIAGNOSIS — Z859 Personal history of malignant neoplasm, unspecified: Secondary | ICD-10-CM | POA: Insufficient documentation

## 2015-02-13 DIAGNOSIS — Z7982 Long term (current) use of aspirin: Secondary | ICD-10-CM | POA: Diagnosis not present

## 2015-02-13 DIAGNOSIS — S0181XA Laceration without foreign body of other part of head, initial encounter: Secondary | ICD-10-CM | POA: Diagnosis present

## 2015-02-13 DIAGNOSIS — W01198A Fall on same level from slipping, tripping and stumbling with subsequent striking against other object, initial encounter: Secondary | ICD-10-CM | POA: Diagnosis not present

## 2015-02-13 DIAGNOSIS — Y998 Other external cause status: Secondary | ICD-10-CM | POA: Insufficient documentation

## 2015-02-13 DIAGNOSIS — I10 Essential (primary) hypertension: Secondary | ICD-10-CM | POA: Diagnosis not present

## 2015-02-13 DIAGNOSIS — Z8669 Personal history of other diseases of the nervous system and sense organs: Secondary | ICD-10-CM | POA: Insufficient documentation

## 2015-02-13 DIAGNOSIS — Y9389 Activity, other specified: Secondary | ICD-10-CM | POA: Insufficient documentation

## 2015-02-13 MED ORDER — LIDOCAINE-EPINEPHRINE-TETRACAINE (LET) SOLUTION
3.0000 mL | Freq: Once | NASAL | Status: AC
  Administered 2015-02-13: 3 mL via TOPICAL
  Filled 2015-02-13: qty 3

## 2015-02-13 MED ORDER — LORAZEPAM 1 MG PO TABS
1.0000 mg | ORAL_TABLET | Freq: Once | ORAL | Status: AC
  Administered 2015-02-13: 1 mg via ORAL
  Filled 2015-02-13: qty 1

## 2015-02-13 NOTE — ED Notes (Signed)
Per EMS pt from [facility] sent for evaluation post fall. Per staff pt was walking with cane; lost balance resulting to 1 inch laceration above left eyebrow; no LOC. Alert and oriented to self per normal.

## 2015-02-13 NOTE — Discharge Instructions (Signed)
Facial Laceration ° A facial laceration is a cut on the face. These injuries can be painful and cause bleeding. Lacerations usually heal quickly, but they need special care to reduce scarring. °DIAGNOSIS  °Your health care provider will take a medical history, ask for details about how the injury occurred, and examine the wound to determine how deep the cut is. °TREATMENT  °Some facial lacerations may not require closure. Others may not be able to be closed because of an increased risk of infection. The risk of infection and the chance for successful closure will depend on various factors, including the amount of time since the injury occurred. °The wound may be cleaned to help prevent infection. If closure is appropriate, pain medicines may be given if needed. Your health care provider will use stitches (sutures), wound glue (adhesive), or skin adhesive strips to repair the laceration. These tools bring the skin edges together to allow for faster healing and a better cosmetic outcome. If needed, you may also be given a tetanus shot. °HOME CARE INSTRUCTIONS °· Only take over-the-counter or prescription medicines as directed by your health care provider. °· Follow your health care provider's instructions for wound care. These instructions will vary depending on the technique used for closing the wound. °For Sutures: °· Keep the wound clean and dry.   °· If you were given a bandage (dressing), you should change it at least once a day. Also change the dressing if it becomes wet or dirty, or as directed by your health care provider.   °· Wash the wound with soap and water 2 times a day. Rinse the wound off with water to remove all soap. Pat the wound dry with a clean towel.   °· After cleaning, apply a thin layer of the antibiotic ointment recommended by your health care provider. This will help prevent infection and keep the dressing from sticking.   °· You may shower as usual after the first 24 hours. Do not soak the  wound in water until the sutures are removed.   °· Get your sutures removed as directed by your health care provider. With facial lacerations, sutures should usually be taken out after 4-5 days to avoid stitch marks.   °· Wait a few days after your sutures are removed before applying any makeup. °For Skin Adhesive Strips: °· Keep the wound clean and dry.   °· Do not get the skin adhesive strips wet. You may bathe carefully, using caution to keep the wound dry.   °· If the wound gets wet, pat it dry with a clean towel.   °· Skin adhesive strips will fall off on their own. You may trim the strips as the wound heals. Do not remove skin adhesive strips that are still stuck to the wound. They will fall off in time.   °For Wound Adhesive: °· You may briefly wet your wound in the shower or bath. Do not soak or scrub the wound. Do not swim. Avoid periods of heavy sweating until the skin adhesive has fallen off on its own. After showering or bathing, gently pat the wound dry with a clean towel.   °· Do not apply liquid medicine, cream medicine, ointment medicine, or makeup to your wound while the skin adhesive is in place. This may loosen the film before your wound is healed.   °· If a dressing is placed over the wound, be careful not to apply tape directly over the skin adhesive. This may cause the adhesive to be pulled off before the wound is healed.   °· Avoid   prolonged exposure to sunlight or tanning lamps while the skin adhesive is in place.  The skin adhesive will usually remain in place for 5-10 days, then naturally fall off the skin. Do not pick at the adhesive film.  After Healing: Once the wound has healed, cover the wound with sunscreen during the day for 1 full year. This can help minimize scarring. Exposure to ultraviolet light in the first year will darken the scar. It can take 1-2 years for the scar to lose its redness and to heal completely.  SEEK IMMEDIATE MEDICAL CARE IF:  You have redness, pain, or  swelling around the wound.   You see ayellowish-white fluid (pus) coming from the wound.   You have chills or a fever.  MAKE SURE YOU:  Understand these instructions.  Will watch your condition.  Will get help right away if you are not doing well or get worse. Document Released: 09/05/2004 Document Revised: 05/19/2013 Document Reviewed: 03/11/2013 [facility] Patient Information 2015 [place], [place]. This information is not intended to replace advice given to you by your health care provider. Make sure you discuss any questions you have with your health care provider.  Head Injury You have a head injury. Headaches and throwing up (vomiting) are common after a head injury. It should be easy to wake up from sleeping. Sometimes you must stay in the hospital. Most problems happen within the first 24 hours. Side effects may occur up to 7-10 days after the injury.  WHAT ARE THE TYPES OF HEAD INJURIES? Head injuries can be as minor as a bump. Some head injuries can be more severe. More severe head injuries include:  A jarring injury to the brain (concussion).  A bruise of the brain (contusion). This mean there is bleeding in the brain that can cause swelling.  A cracked skull (skull fracture).  Bleeding in the brain that collects, clots, and forms a bump (hematoma). WHEN SHOULD I GET HELP RIGHT AWAY?   You are confused or sleepy.  You cannot be woken up.  You feel sick to your stomach (nauseous) or keep throwing up (vomiting).  Your dizziness or unsteadiness is getting worse.  You have very bad, lasting headaches that are not helped by medicine. Take medicines only as told by your doctor.  You cannot use your arms or legs like normal.  You cannot walk.  You notice changes in the black spots in the center of the colored part of your eye (pupil).  You have clear or bloody fluid coming from your nose or ears.  You have trouble seeing. During the next 24 hours after the injury,  you must stay with someone who can watch you. This person should get help right away (call 911 in the [place] if you start to shake and are not able to control it (have seizures), you pass out, or you are unable to wake up. HOW CAN I PREVENT A HEAD INJURY IN THE FUTURE?  Wear seat belts.  Wear a helmet while bike riding and playing sports like football.  Stay away from dangerous activities around the house. WHEN CAN I RETURN TO NORMAL ACTIVITIES AND ATHLETICS? See your doctor before doing these activities. You should not do normal activities or play contact sports until 1 week after the following symptoms have stopped:  Headache that does not go away.  Dizziness.  Poor attention.  Confusion.  Memory problems.  Sickness to your stomach or throwing up.  Tiredness.  Fussiness.  Bothered by bright lights  or loud noises.  Anxiousness or depression.  Restless sleep. MAKE SURE YOU:   Understand these instructions.  Will watch your condition.  Will get help right away if you are not doing well or get worse. Document Released: 07/11/2008 Document Revised: 12/13/2013 Document Reviewed: 04/05/2013 [facility] Patient Information 2015 [place], [place]. This information is not intended to replace advice given to you by your health care provider. Make sure you discuss any questions you have with your health care provider.

## 2015-02-13 NOTE — ED Notes (Signed)
Bed: WA17 Expected date:  Expected time:  Means of arrival:  Comments: fall 

## 2015-02-13 NOTE — ED Provider Notes (Signed)
CSN: [id number]     Arrival date & time 02/13/15  1152 History   First MD Initiated Contact with Patient 02/13/15 1154     Chief Complaint  Patient presents with  . Fall     (Consider location/radiation/quality/duration/timing/severity/associated sxs/prior Treatment) HPI   81 year old female presenting after fall. Apparently ambulating with cane and lost balance. [address] and struck head. Small forehead lacerations. No LOC. Baseline mental status per report. Pt unable to provide much useful history. Seems unaware of laceration. Denies any pain anywhere. No anticoagulants per med list.   Past Medical History  Diagnosis Date  . Diabetes mellitus   . Hypertension   . Hypercholesteremia   . Neuropathy   . Cancer    Past Surgical History  Procedure Laterality Date  . Abdominal hysterectomy    . Appendectomy     Family History  Problem Relation Age of Onset  . Stroke Sister   . Heart failure Sister   . Diabetes Sister   . Cancer Brother    History  Substance Use Topics  . Smoking status: Never Smoker   . Smokeless tobacco: Never Used  . Alcohol Use: No   OB History    No data available     Review of Systems  Level 5 caveat because of dementia.   Allergies  Codeine; Penicillins; and Pregabalin  Home Medications   Prior to Admission medications   Medication Sig Start Date End Date Taking? Authorizing Provider  acetaminophen (MAPAP ARTHRITIS PAIN) 650 MG CR tablet Take 650 mg by mouth 2 (two) times daily.   Yes Historical Provider, MD  acetaminophen (MAPAP) 325 MG tablet Take 650 mg by mouth every 6 (six) hours as needed (For pain.).   Yes Historical Provider, MD  amLODipine (NORVASC) 5 MG tablet Take 5 mg by mouth daily.   Yes Historical Provider, MD  aspirin 81 MG chewable tablet Chew 81 mg by mouth daily.   Yes Historical Provider, MD  donepezil (ARICEPT) 10 MG tablet Take 10 mg by mouth daily.    Yes Historical Provider, MD  furosemide (LASIX) 40 MG tablet Take 40 mg  by mouth daily.   Yes Historical Provider, MD  losartan (COZAAR) 100 MG tablet Take 100 mg by mouth daily.   Yes Historical Provider, MD  memantine (NAMENDA) 10 MG tablet Take 10 mg by mouth 2 (two) times daily.     Yes Historical Provider, MD  metFORMIN (GLUCOPHAGE) 1000 MG tablet Take 1,000 mg by mouth at bedtime.    Yes Historical Provider, MD  Multiple Vitamins-Minerals (CERTA-VITE PO) Take 1 tablet by mouth daily.   Yes Historical Provider, MD  potassium chloride SA (K-DUR,KLOR-CON) 20 MEQ tablet Take 20 mEq by mouth daily.    Yes Historical Provider, MD  QUEtiapine (SEROQUEL) 25 MG tablet Take 25 mg by mouth 2 (two) times daily.    Yes Historical Provider, MD   BP 154/74 mmHg  Pulse 74  Temp(Src) 98 F (36.7 C) (Oral)  Resp 17  SpO2 100% Physical Exam  Constitutional: She appears well-developed and well-nourished. No distress.  HENT:  Head: Normocephalic.    1.2 cm laceration in pictured area. No active bleeding.   Eyes: Conjunctivae are normal. Right eye exhibits no discharge. Left eye exhibits no discharge.  Neck: Neck supple.  Cardiovascular: Normal rate, regular rhythm and normal heart sounds.  Exam reveals no gallop and no friction rub.   No murmur heard. Pulmonary/Chest: Effort normal and breath sounds normal. No respiratory distress.  Abdominal:  Soft. She exhibits no distension. There is no tenderness.  Musculoskeletal: She exhibits no edema or tenderness.  No apparent midline spinal tenderness. No bony tenderness of extremities. No apparent pain with ROM of large joints.   Neurological: She is alert. No cranial nerve deficit. She exhibits normal muscle tone. Coordination normal.  Follows commands. No focal motor deficit  Skin: Skin is warm and dry.  Nursing note and vitals reviewed.   ED Course  Procedures (including critical care time)  LACERATION REPAIR Performed by: Virgel Manifold Authorized by: Virgel Manifold Consent: Verbal consent obtained. Risks and  benefits: risks, benefits and alternatives were discussed Consent given by: patient Patient identity confirmed: provided demographic data Prepped and Draped in normal sterile fashion Wound explored  Laceration Location: forehead  Laceration Length: 1.2 cm  No Foreign Bodies seen or palpated  Anesthesia: local infiltration  Local anesthetic: LET   Irrigation method: syringe Amount of cleaning: standard  Skin closure: 5-0 prolene  Number of sutures: 2   Technique: simple interupted  Patient tolerance: Patient tolerated the procedure well with no immediate complications.  Labs Review Labs Reviewed - No data to display  Imaging Review Ct Head Wo Contrast  02/13/2015   CLINICAL DATA:  Was walking with a cane, lost balance and fell, LEFT supraorbital laceration, no loss of consciousness, history hypertension, diabetes mellitus, hypercholesterolemia  EXAM: CT HEAD WITHOUT CONTRAST  CT CERVICAL SPINE WITHOUT CONTRAST  TECHNIQUE: Multidetector CT imaging of the head and cervical spine was performed following the standard protocol without intravenous contrast. Multiplanar CT image reconstructions of the cervical spine were also generated.  COMPARISON:  01/07/2015  FINDINGS: CT HEAD FINDINGS  Generalized atrophy.  Normal ventricular morphology.  No midline shift or mass effect.  Small vessel chronic ischemic changes of deep cerebral white matter.  No intracranial hemorrhage, mass lesion, or acute infarction.  Visualized paranasal sinuses and mastoid air cells clear.  Bones demineralized but intact.  Small LEFT supraorbital scalp hematoma.  CT CERVICAL SPINE FINDINGS  Prior anterior fusion C3-C5 with intact hardware in stable appearance.  Prevertebral soft tissues normal thickness.  Fusion of C6-C7.  Disc space narrowing with endplate spur formation and bridging osteophytes at C2-C3.  Scattered facet degenerative changes.  Significant narrowing of the bony neural foramina bilaterally at C3-C4 and  C4-C5 due to short pedicles and endplate spurring with residual AP diameter of spinal canal 8 mm at C4.  Greatest degree of neural foraminal narrowing is at LEFT C4-C5.  Vertebral body heights appear maintained without fracture or subluxation.  Appearance cervical spine appears unchanged since previous exam.  Lung apices clear.  IMPRESSION: Atrophy with small vessel chronic ischemic changes of deep cerebral white matter.  No acute intracranial abnormalities.  Prior C3-C5 fusion with intact hardware.  Apparent fusion of C6-C7 with additional bridging osteophytes at C2-C3.  Multilevel degenerative disc and facet disease changes with BILATERAL neural foraminal stenoses in AP spinal stenosis at C4 as above.  No acute cervical spine abnormalities identified.   Electronically Signed   By: Lavonia Dana M.D.   On: 02/13/2015 13:08   Ct Cervical Spine Wo Contrast  02/13/2015   CLINICAL DATA:  Was walking with a cane, lost balance and fell, LEFT supraorbital laceration, no loss of consciousness, history hypertension, diabetes mellitus, hypercholesterolemia  EXAM: CT HEAD WITHOUT CONTRAST  CT CERVICAL SPINE WITHOUT CONTRAST  TECHNIQUE: Multidetector CT imaging of the head and cervical spine was performed following the standard protocol without intravenous contrast. Multiplanar CT image reconstructions  of the cervical spine were also generated.  COMPARISON:  01/07/2015  FINDINGS: CT HEAD FINDINGS  Generalized atrophy.  Normal ventricular morphology.  No midline shift or mass effect.  Small vessel chronic ischemic changes of deep cerebral white matter.  No intracranial hemorrhage, mass lesion, or acute infarction.  Visualized paranasal sinuses and mastoid air cells clear.  Bones demineralized but intact.  Small LEFT supraorbital scalp hematoma.  CT CERVICAL SPINE FINDINGS  Prior anterior fusion C3-C5 with intact hardware in stable appearance.  Prevertebral soft tissues normal thickness.  Fusion of C6-C7.  Disc space narrowing  with endplate spur formation and bridging osteophytes at C2-C3.  Scattered facet degenerative changes.  Significant narrowing of the bony neural foramina bilaterally at C3-C4 and C4-C5 due to short pedicles and endplate spurring with residual AP diameter of spinal canal 8 mm at C4.  Greatest degree of neural foraminal narrowing is at LEFT C4-C5.  Vertebral body heights appear maintained without fracture or subluxation.  Appearance cervical spine appears unchanged since previous exam.  Lung apices clear.  IMPRESSION: Atrophy with small vessel chronic ischemic changes of deep cerebral white matter.  No acute intracranial abnormalities.  Prior C3-C5 fusion with intact hardware.  Apparent fusion of C6-C7 with additional bridging osteophytes at C2-C3.  Multilevel degenerative disc and facet disease changes with BILATERAL neural foraminal stenoses in AP spinal stenosis at C4 as above.  No acute cervical spine abnormalities identified.   Electronically Signed   By: Lavonia Dana M.D.   On: 02/13/2015 13:08     EKG Interpretation None      MDM   Final diagnoses:  Fall, initial encounter  Forehead laceration, initial encounter   83yF s/p fall. Reportedly at baseline. No focal motor deficit. Imaging of the head and C-spine without serious acute traumatic injury. Forehead laceration was repaired. Continue wound care. Follow-up as needed otherwise.    Virgel Manifold, MD 02/14/15 [phone]

## 2015-12-14 ENCOUNTER — Encounter (HOSPITAL_COMMUNITY): Payer: Self-pay

## 2015-12-14 ENCOUNTER — Emergency Department (HOSPITAL_COMMUNITY): Payer: Medicare (Managed Care)

## 2015-12-14 ENCOUNTER — Emergency Department (HOSPITAL_COMMUNITY)
Admission: EM | Admit: 2015-12-14 | Discharge: 2015-12-14 | Disposition: A | Discharge status: Home / Self Care | Payer: Medicare (Managed Care) | Attending: Emergency Medicine | Admitting: Emergency Medicine

## 2015-12-14 DIAGNOSIS — E1121 Type 2 diabetes mellitus with diabetic nephropathy: Secondary | ICD-10-CM | POA: Diagnosis not present

## 2015-12-14 DIAGNOSIS — S0990XA Unspecified injury of head, initial encounter: Secondary | ICD-10-CM | POA: Insufficient documentation

## 2015-12-14 DIAGNOSIS — W050XXA Fall from non-moving wheelchair, initial encounter: Secondary | ICD-10-CM | POA: Insufficient documentation

## 2015-12-14 DIAGNOSIS — Z7984 Long term (current) use of oral hypoglycemic drugs: Secondary | ICD-10-CM | POA: Diagnosis not present

## 2015-12-14 DIAGNOSIS — Z7982 Long term (current) use of aspirin: Secondary | ICD-10-CM | POA: Insufficient documentation

## 2015-12-14 DIAGNOSIS — Y9289 Other specified places as the place of occurrence of the external cause: Secondary | ICD-10-CM | POA: Insufficient documentation

## 2015-12-14 DIAGNOSIS — Z79899 Other long term (current) drug therapy: Secondary | ICD-10-CM | POA: Diagnosis not present

## 2015-12-14 DIAGNOSIS — Y999 Unspecified external cause status: Secondary | ICD-10-CM | POA: Diagnosis not present

## 2015-12-14 DIAGNOSIS — Y939 Activity, unspecified: Secondary | ICD-10-CM | POA: Diagnosis not present

## 2015-12-14 DIAGNOSIS — S0083XA Contusion of other part of head, initial encounter: Secondary | ICD-10-CM

## 2015-12-14 DIAGNOSIS — R040 Epistaxis: Secondary | ICD-10-CM | POA: Diagnosis present

## 2015-12-14 DIAGNOSIS — E7800 Pure hypercholesterolemia, unspecified: Secondary | ICD-10-CM | POA: Diagnosis not present

## 2015-12-14 DIAGNOSIS — I10 Essential (primary) hypertension: Secondary | ICD-10-CM | POA: Insufficient documentation

## 2015-12-14 HISTORY — DX: Dementia in other diseases classified elsewhere, unspecified severity, without behavioral disturbance, psychotic disturbance, mood disturbance, and anxiety: F02.80

## 2015-12-14 HISTORY — DX: Edema, unspecified: R60.9

## 2015-12-14 HISTORY — DX: Alzheimer's disease, unspecified: G30.9

## 2015-12-14 LAB — CBG MONITORING, ED: Glucose-Capillary: 448 mg/dL — ABNORMAL HIGH (ref 65–99)

## 2015-12-14 MED ORDER — INSULIN ASPART 100 UNIT/ML ~~LOC~~ SOLN
15.0000 [IU] | Freq: Once | SUBCUTANEOUS | Status: AC
  Administered 2015-12-14: 15 [IU] via SUBCUTANEOUS
  Filled 2015-12-14: qty 1

## 2015-12-14 NOTE — Discharge Instructions (Signed)
Facial or Scalp Contusion ° A facial or scalp contusion is a deep bruise on the face or head. Contusions happen when an injury causes bleeding under the skin. Signs of bruising include pain, puffiness (swelling), and discolored skin. The contusion may turn blue, purple, or yellow. °HOME CARE °· Only take medicines as told by your doctor. °· Put ice on the injured area. °¨ Put ice in a plastic bag. °¨ Place a towel between your skin and the bag. °¨ Leave the ice on for 20 minutes, 2-3 times a day. °GET HELP IF: °· You have bite problems. °· You have pain when chewing. °· You are worried about your face not healing normally. °GET HELP RIGHT AWAY IF:  °· You have severe pain or a headache and medicine does not help. °· You are very tired or confused, or your personality changes. °· You throw up (vomit). °· You have a nosebleed that will not stop. °· You see two of everything (double vision) or have blurry vision. °· You have fluid coming from your nose or ear. °· You have problems walking or using your arms or legs. °MAKE SURE YOU:  °· Understand these instructions. °· Will watch your condition. °· Will get help right away if you are not doing well or get worse. °  °This information is not intended to replace advice given to you by your health care provider. Make sure you discuss any questions you have with your health care provider. °  °Document Released: 07/18/2011 Document Revised: 08/19/2014 Document Reviewed: 03/11/2013 °Elsevier Interactive Patient Education ©2016 [employer] ° °

## 2015-12-14 NOTE — ED Provider Notes (Addendum)
CSN: [id number]     Arrival date & time 12/14/15  1119 History   First MD Initiated Contact with Patient 12/14/15 1124     Chief Complaint  Patient presents with  . Fall     (Consider location/radiation/quality/duration/timing/severity/associated sxs/prior Treatment) HPI Comments: Nursing home called EMS because they were concerned that patient may lost tooth because there was blood in her mouth. A clot in her mouth was removed by EMS in transport here.  Patient is a 82 y.o. female presenting with fall. The history is provided by the EMS personnel and the nursing home. The history is limited by the absence of a caregiver.  Fall This is a new (pt is demented but sitting in her wheelchair and fell face first on the floor) problem. The current episode started 1 to 2 hours ago. The problem occurs constantly. The problem has been resolved. Associated symptoms comments: Nose bleed and blood in the mouth. . Nothing aggravates the symptoms. Nothing relieves the symptoms. She has tried nothing for the symptoms.    Past Medical History  Diagnosis Date  . Diabetes mellitus   . Hypertension   . Hypercholesteremia   . Neuropathy   . Cancer    Past Surgical History  Procedure Laterality Date  . Abdominal hysterectomy    . Appendectomy     Family History  Problem Relation Age of Onset  . Stroke Sister   . Heart failure Sister   . Diabetes Sister   . Cancer Brother    Social History  Substance Use Topics  . Smoking status: Never Smoker   . Smokeless tobacco: Never Used  . Alcohol Use: No   OB History    No data available     Review of Systems  Unable to perform ROS: Dementia      Allergies  Codeine; Penicillins; and Pregabalin  Home Medications   Prior to Admission medications   Medication Sig Start Date End Date Taking? Authorizing Provider  acetaminophen (MAPAP ARTHRITIS PAIN) 650 MG CR tablet Take 650 mg by mouth 2 (two) times daily.    Historical Provider, MD   acetaminophen (MAPAP) 325 MG tablet Take 650 mg by mouth every 6 (six) hours as needed (For pain.).    Historical Provider, MD  amLODipine (NORVASC) 5 MG tablet Take 5 mg by mouth daily.    Historical Provider, MD  aspirin 81 MG chewable tablet Chew 81 mg by mouth daily.    Historical Provider, MD  donepezil (ARICEPT) 10 MG tablet Take 10 mg by mouth daily.     Historical Provider, MD  furosemide (LASIX) 40 MG tablet Take 40 mg by mouth daily.    Historical Provider, MD  losartan (COZAAR) 100 MG tablet Take 100 mg by mouth daily.    Historical Provider, MD  memantine (NAMENDA) 10 MG tablet Take 10 mg by mouth 2 (two) times daily.      Historical Provider, MD  metFORMIN (GLUCOPHAGE) 1000 MG tablet Take 1,000 mg by mouth at bedtime.     Historical Provider, MD  Multiple Vitamins-Minerals (CERTA-VITE PO) Take 1 tablet by mouth daily.    Historical Provider, MD  potassium chloride SA (K-DUR,KLOR-CON) 20 MEQ tablet Take 20 mEq by mouth daily.     Historical Provider, MD  QUEtiapine (SEROQUEL) 25 MG tablet Take 25 mg by mouth 2 (two) times daily.     Historical Provider, MD   BP 163/70 mmHg  Pulse 75  Resp 15  SpO2 100% Physical Exam  Constitutional:  She appears well-developed and well-nourished. No distress.  HENT:  Head: Normocephalic. Head is with contusion.    Mouth/Throat: Oropharynx is clear and moist.  No traumatic dental avulsions.  Multiple missing teeth but all appear chronic.  No gum trauma.  Hemostatic epistaxis out of both nares  Eyes: Conjunctivae and EOM are normal. Pupils are equal, round, and reactive to light.  Neck: Normal range of motion. Neck supple. No spinous process tenderness and no muscular tenderness present.  Cardiovascular: Normal rate, regular rhythm and intact distal pulses.   No murmur heard. Pulmonary/Chest: Effort normal and breath sounds normal. No respiratory distress. She has no wheezes. She has no rales.  Abdominal: Soft. She exhibits no distension.  There is no tenderness. There is no rebound and no guarding.  Musculoskeletal: Normal range of motion. She exhibits no edema or tenderness.  Neurological: She is alert.  Not oriented to person or place or time  Skin: Skin is warm and dry. No rash noted. No erythema.  Psychiatric: She has a normal mood and affect. Her behavior is normal.  Nursing note and vitals reviewed.   ED Course  Procedures (including critical care time) Labs Review Labs Reviewed - No data to display  Imaging Review Ct Head Wo Contrast  12/14/2015  CLINICAL DATA:  Right forehead hematoma after fall out of wheelchair at nursing facility. No loss of consciousness. EXAM: CT HEAD WITHOUT CONTRAST CT CERVICAL SPINE WITHOUT CONTRAST TECHNIQUE: Multidetector CT imaging of the head and cervical spine was performed following the standard protocol without intravenous contrast. Multiplanar CT image reconstructions of the cervical spine were also generated. COMPARISON:  CT scan February 13, 2015. FINDINGS: CT HEAD FINDINGS Bony calvarium appears intact. Moderate right frontal scalp hematoma is noted. Mild diffuse cortical atrophy is noted. Mild chronic ischemic white matter disease is noted. No mass effect or midline shift is noted. Ventricular size is within normal limits. There is no evidence of mass lesion, hemorrhage or acute infarction. CT CERVICAL SPINE FINDINGS Status post surgical anterior fusion of C3, C4-C5. Fusion of C6 and C7 vertebral bodies is noted most likely due to degenerative change. Moderate degenerative disc disease is noted at C5-6 with anterior osteophyte formation. No fracture or significant spondylolisthesis is noted. Visualized lung fields appear normal. IMPRESSION: Moderate right frontal scalp hematoma. Mild diffuse cortical atrophy. Mild chronic ischemic white matter disease. No acute intracranial abnormality seen. Postsurgical and degenerative changes are noted in the cervical spine. No acute fracture or  spondylolisthesis is noted. Electronically Signed   By: Marijo Conception, M.D.   On: 12/14/2015 14:06   Ct Cervical Spine Wo Contrast  12/14/2015  CLINICAL DATA:  Right forehead hematoma after fall out of wheelchair at nursing facility. No loss of consciousness. EXAM: CT HEAD WITHOUT CONTRAST CT CERVICAL SPINE WITHOUT CONTRAST TECHNIQUE: Multidetector CT imaging of the head and cervical spine was performed following the standard protocol without intravenous contrast. Multiplanar CT image reconstructions of the cervical spine were also generated. COMPARISON:  CT scan February 13, 2015. FINDINGS: CT HEAD FINDINGS Bony calvarium appears intact. Moderate right frontal scalp hematoma is noted. Mild diffuse cortical atrophy is noted. Mild chronic ischemic white matter disease is noted. No mass effect or midline shift is noted. Ventricular size is within normal limits. There is no evidence of mass lesion, hemorrhage or acute infarction. CT CERVICAL SPINE FINDINGS Status post surgical anterior fusion of C3, C4-C5. Fusion of C6 and C7 vertebral bodies is noted most likely due to degenerative change. Moderate  degenerative disc disease is noted at C5-6 with anterior osteophyte formation. No fracture or significant spondylolisthesis is noted. Visualized lung fields appear normal. IMPRESSION: Moderate right frontal scalp hematoma. Mild diffuse cortical atrophy. Mild chronic ischemic white matter disease. No acute intracranial abnormality seen. Postsurgical and degenerative changes are noted in the cervical spine. No acute fracture or spondylolisthesis is noted. Electronically Signed   By: Marijo Conception, M.D.   On: 12/14/2015 14:06   I have personally reviewed and evaluated these images and lab results as part of my medical decision-making.   EKG Interpretation None      MDM   Final diagnoses:  Contusion of forehead, initial encounter    Patient with a mechanical fall out of her wheelchair with injury to the for head  and epistaxis which is now resolved.  Patient is demented and unable to answer any questions however nursing home was concerned that she had a dental avulsion. However there are no dental trauma seen today. Epistaxis is now resolved. CT of the head and neck pending. Patient takes no anticoagulants.  2:16 PM Imaging neg.  Pt will be d/ced back to nursing facility.  When EMS check patient's blood sugar was 414. She does have history of diabetes and unknown if she's received her medications this morning. However patient does not display any signs of DKA and has normal vital signs. Recheck blood sugar is 448 and pt given sq insulin but o/w ready for d/c.  Blanchie Dessert, MD 12/14/15 [place], MD 12/14/15 1439

## 2015-12-14 NOTE — ED Notes (Addendum)
Report called to staff at [facility].  [facility] notified of patient discharge.

## 2015-12-14 NOTE — ED Notes (Signed)
Patient transported by [facility] from [facility].  Patient fell face first out of wheelchair onto hard floor.  Patient landed on right side - patient has hematoma to right forehead and nosebleed - no active bleeding upon arrival.  Patient takes ASA daily.  No LOC - witnessed by staff.  EMS reports CBG en route 414.  Patient baseline alert and disoriented.

## 2015-12-14 NOTE — ED Notes (Signed)
CBG 448. 

## 2016-12-15 IMAGING — CT CT HEAD W/O CM
5 of 7 series · 17 of 47 positions shown, 19 images · non-contrast
Comparison: CT scan February 13, 2015.

CLINICAL DATA: Right forehead hematoma after fall out of wheelchair
at nursing facility. No loss of consciousness.

EXAM:
CT HEAD WITHOUT CONTRAST
CT CERVICAL SPINE WITHOUT CONTRAST
TECHNIQUE: Multidetector CT imaging of the head and cervical spine was
performed following the standard protocol without intravenous
contrast. Multiplanar CT image reconstructions of the cervical spine
were also generated.

[Series 4: bone windows · axial · 0.43mm/px · z∈[-21,+27]mm · 2 of 48 slices shown]
[im 16/48  bone]
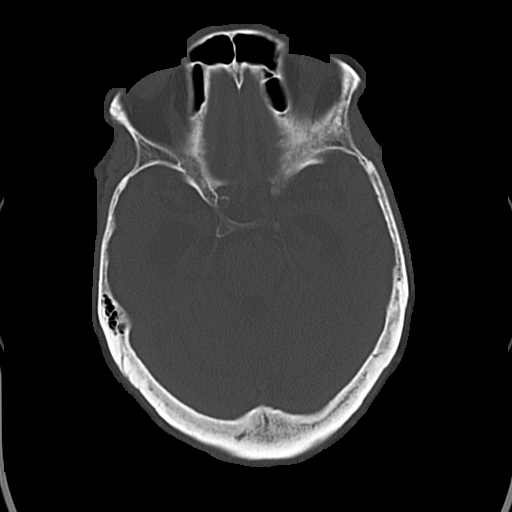
[im 32/48  bone]
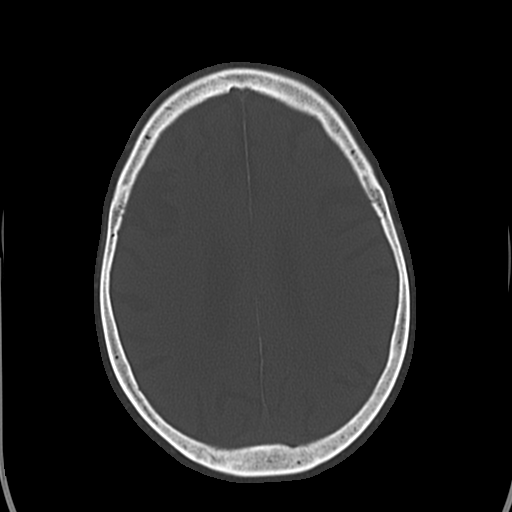

[Series 6: c-spine st · axial · 0.25mm/px · z∈[-212,-160]mm · 3 of 92 slices shown]
[im 14/92  brain]
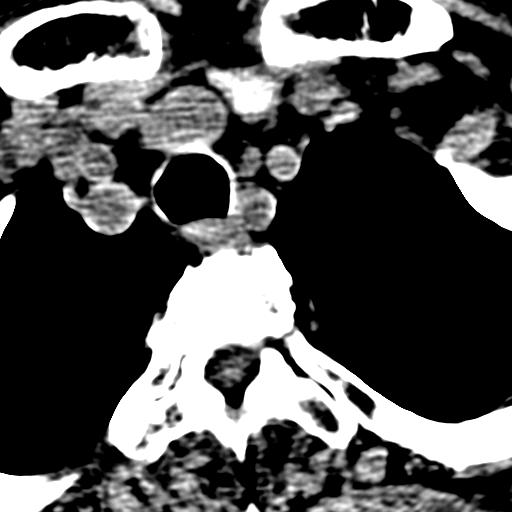
[im 27/92  brain]
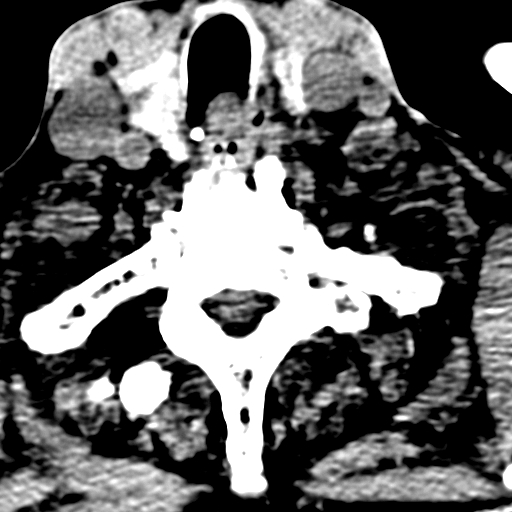
[im 40/92  brain]
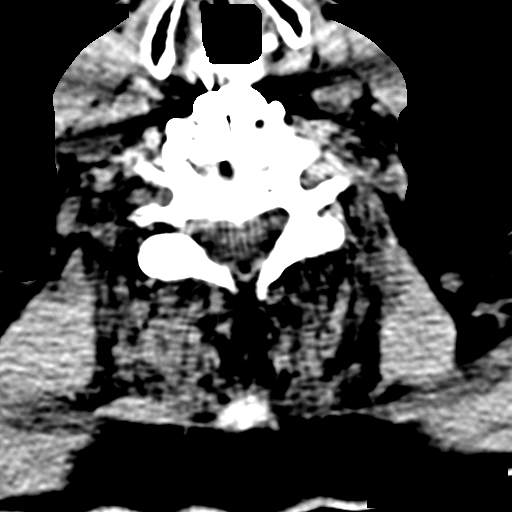

[Series 602: <mpr thick range> · coronal · 0.36mm/px · 3 of 49 slices shown]
[im 17/49  brain]
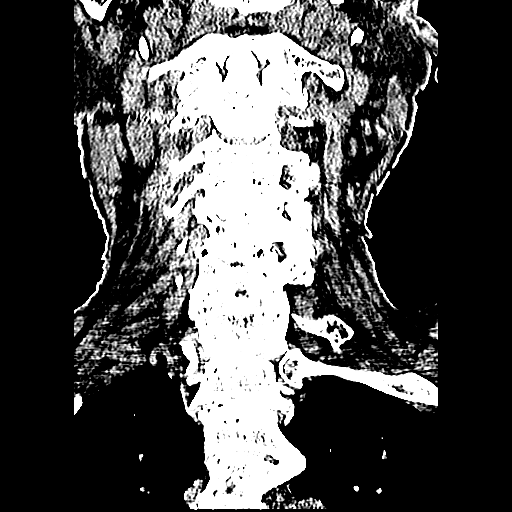
[im 22/49  brain]
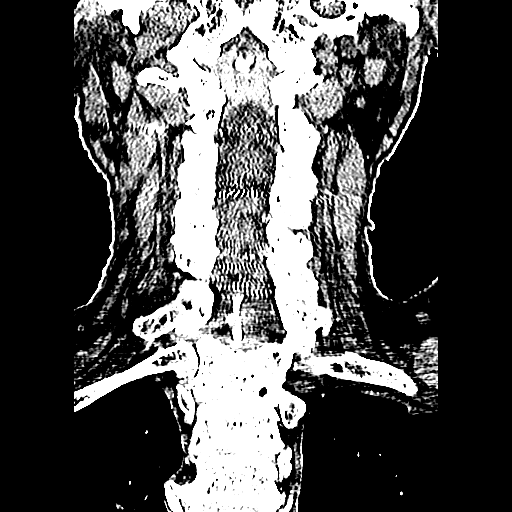
[im 27/49  brain]
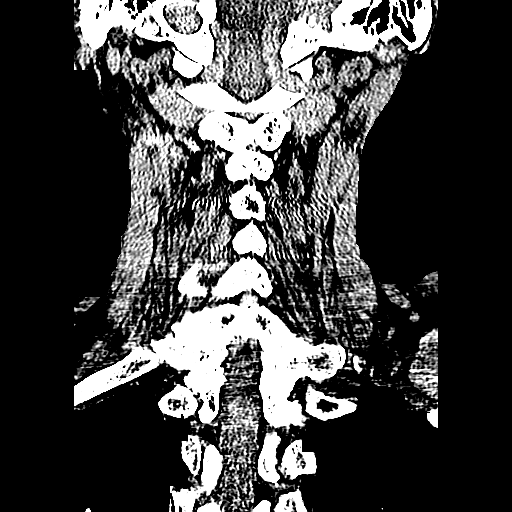

[Series 603: <mpr thick range(1)> · axial · 0.36mm/px · z∈[-238,-103]mm · 6 of 97 slices shown, 8 images]
[im 14/97  brain]
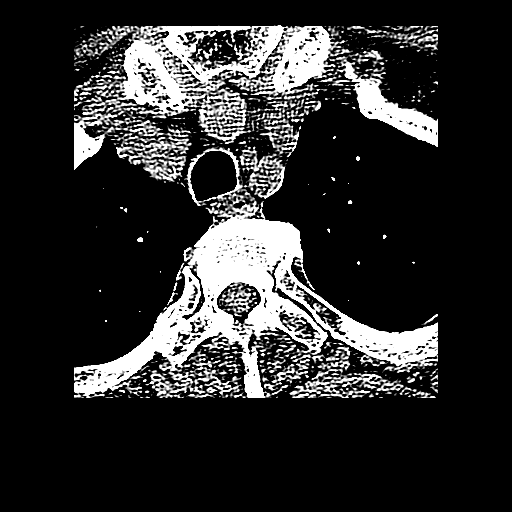
[im 14/97  bone]
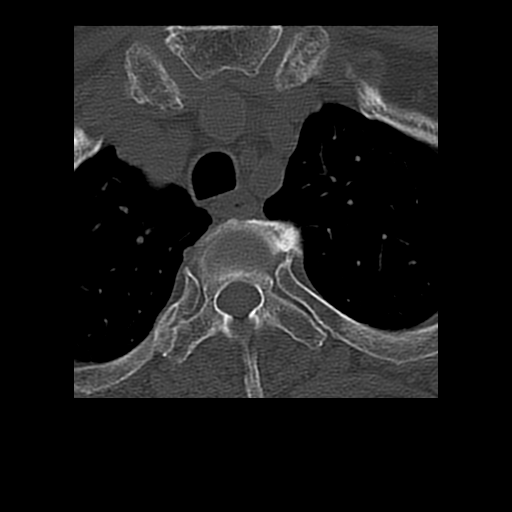
[im 28/97  brain]
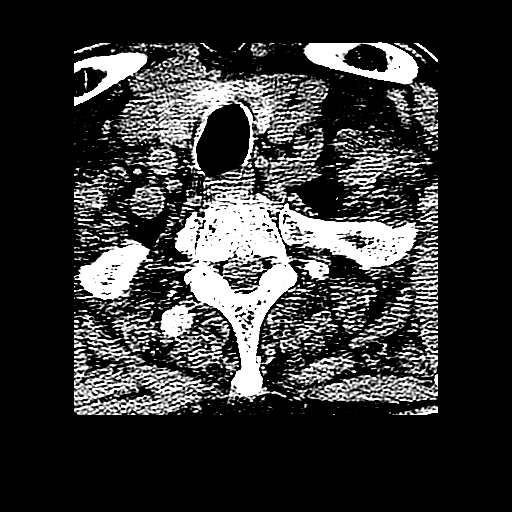
[im 42/97  brain]
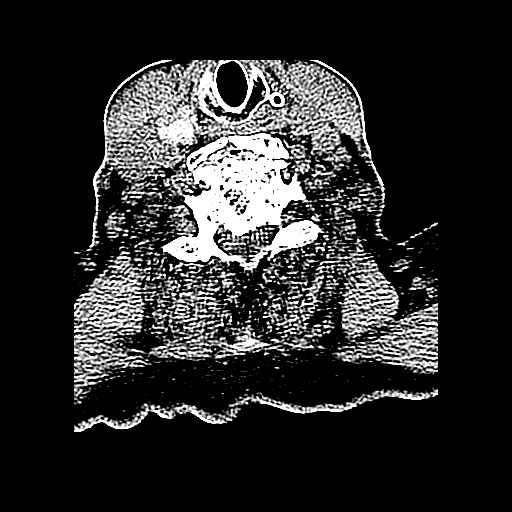
[im 55/97  brain]
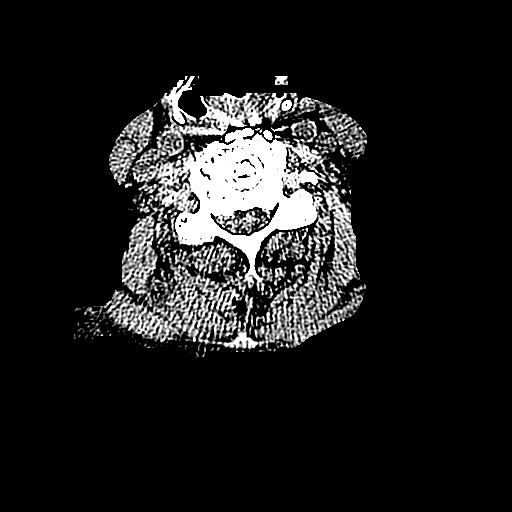
[im 69/97  brain]
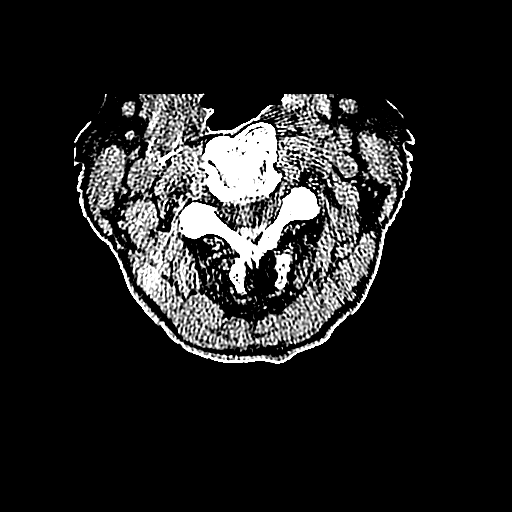
[im 69/97  bone]
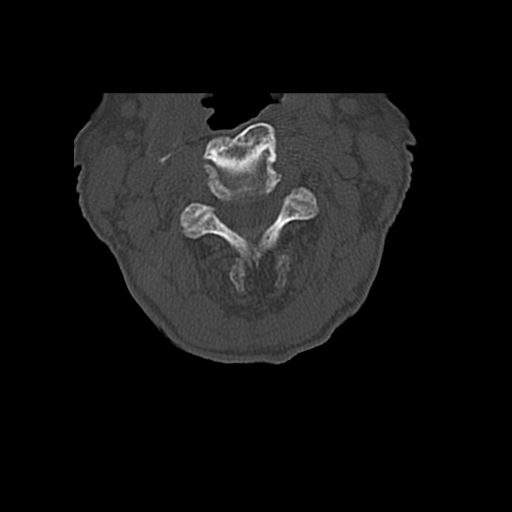
[im 83/97  brain]
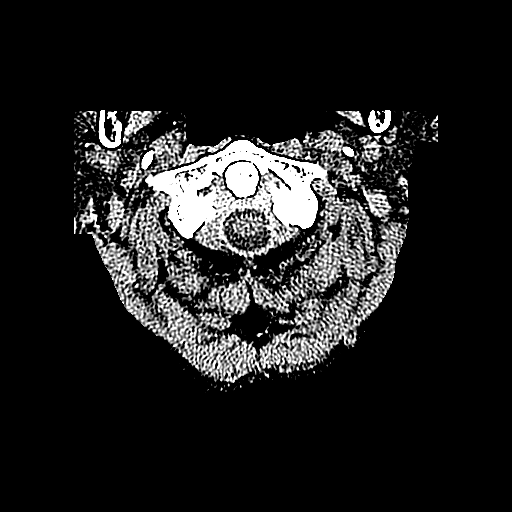

[Series 604: <mpr thick range(2)> · sagittal · 0.36mm/px · 3 of 45 slices shown]
[im 15/45  brain]
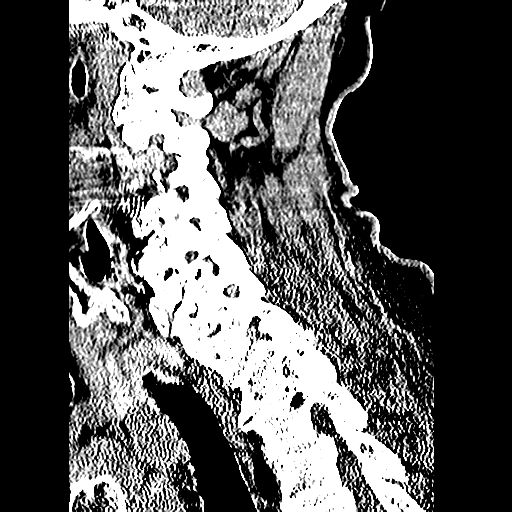
[im 23/45  brain]
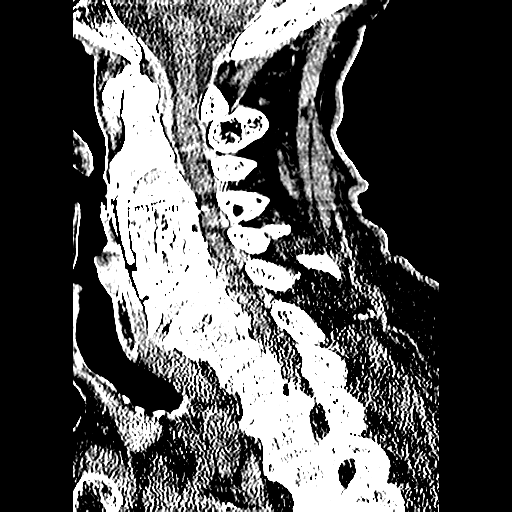
[im 30/45  brain]
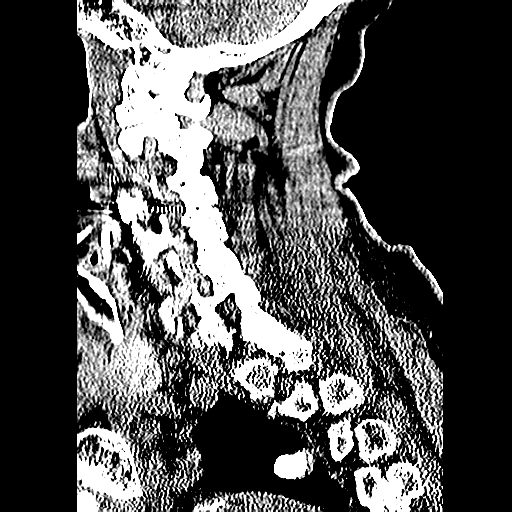

[17 of 47 positions shown; findings below may reference images not displayed]

FINDINGS: CT HEAD FINDINGS

Bony calvarium appears intact. Moderate right frontal scalp hematoma
is noted. Mild diffuse cortical atrophy is noted. Mild chronic
ischemic white matter disease is noted. No mass effect or midline
shift is noted. Ventricular size is within normal limits. There is
no evidence of mass lesion, hemorrhage or acute infarction.

CT CERVICAL SPINE FINDINGS

Status post surgical anterior fusion of C3, C4-C5. Fusion of C6 and
C7 vertebral bodies is noted most likely due to degenerative change.
Moderate degenerative disc disease is noted at C5-6 with anterior
osteophyte formation. No fracture or significant spondylolisthesis
is noted. Visualized lung fields appear normal.
IMPRESSION: Moderate right frontal scalp hematoma. Mild diffuse cortical
atrophy. Mild chronic ischemic white matter disease. No acute
intracranial abnormality seen.

Postsurgical and degenerative changes are noted in the cervical
spine. No acute fracture or spondylolisthesis is noted.

## 2018-01-10 DEATH — deceased
